# Patient Record
Sex: Male | Born: 1990 | Race: White | Hispanic: No | Marital: Single | Smoking: Current every day smoker
Health system: Southern US, Community
[De-identification: ages and names within clinical notes are randomized; demographics above are authoritative.]

## PROBLEM LIST (undated history)

## (undated) DIAGNOSIS — G40309 Generalized idiopathic epilepsy and epileptic syndromes, not intractable, without status epilepticus: Secondary | ICD-10-CM

## (undated) DIAGNOSIS — F1910 Other psychoactive substance abuse, uncomplicated: Secondary | ICD-10-CM

## (undated) DIAGNOSIS — F988 Other specified behavioral and emotional disorders with onset usually occurring in childhood and adolescence: Secondary | ICD-10-CM

## (undated) DIAGNOSIS — G40909 Epilepsy, unspecified, not intractable, without status epilepticus: Secondary | ICD-10-CM

## (undated) HISTORY — DX: Other psychoactive substance abuse, uncomplicated: F19.10

## (undated) HISTORY — DX: Generalized idiopathic epilepsy and epileptic syndromes, not intractable, without status epilepticus: G40.309

## (undated) HISTORY — DX: Other specified behavioral and emotional disorders with onset usually occurring in childhood and adolescence: F98.8

---
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
## 2009-07-23 ENCOUNTER — Emergency Department (HOSPITAL_COMMUNITY): Admission: EM | Admit: 2009-07-23 | Discharge: 2009-07-24 | Payer: Self-pay | Admitting: Emergency Medicine

## 2010-11-02 ENCOUNTER — Emergency Department (HOSPITAL_COMMUNITY)
Admission: EM | Admit: 2010-11-02 | Discharge: 2010-11-03 | Disposition: A | Discharge status: Home / Self Care | Payer: Self-pay | Attending: Emergency Medicine | Admitting: Emergency Medicine

## 2010-11-02 DIAGNOSIS — X58XXXA Exposure to other specified factors, initial encounter: Secondary | ICD-10-CM | POA: Insufficient documentation

## 2010-11-02 DIAGNOSIS — K089 Disorder of teeth and supporting structures, unspecified: Secondary | ICD-10-CM | POA: Insufficient documentation

## 2010-11-02 DIAGNOSIS — S025XXA Fracture of tooth (traumatic), initial encounter for closed fracture: Secondary | ICD-10-CM | POA: Insufficient documentation

## 2010-11-02 DIAGNOSIS — R569 Unspecified convulsions: Secondary | ICD-10-CM | POA: Insufficient documentation

## 2010-11-19 LAB — URINALYSIS, ROUTINE W REFLEX MICROSCOPIC
Hgb urine dipstick: NEGATIVE
Nitrite: NEGATIVE
Protein, ur: NEGATIVE
Specific Gravity, Urine: 1.019
Urobilinogen, UA: 1

## 2010-11-19 LAB — CBC
Hemoglobin: 16.2
MCHC: 34.6
MCV: 88.8
RBC: 5.26
WBC: 11.2 — ABNORMAL HIGH

## 2010-11-19 LAB — DIFFERENTIAL
Eosinophils Absolute: 0.2
Eosinophils Relative: 2
Lymphocytes Relative: 15
Lymphs Abs: 1.7
Neutro Abs: 8.5 — ABNORMAL HIGH
Neutrophils Relative %: 76

## 2010-11-19 LAB — COMPREHENSIVE METABOLIC PANEL
AST: 26
Calcium: 9.2
GFR calc Af Amer: >60
GFR calc non Af Amer: >60
Total Protein: 6.2

## 2012-05-18 ENCOUNTER — Telehealth: Payer: Self-pay

## 2012-05-18 NOTE — Telephone Encounter (Signed)
Patient called clinic for appt and refill on Depakote.  I spoke with pharmacy and he had refill on file, so does not need another refill at this time.  They will contact us when he needs a new rx.

## 2012-08-29 ENCOUNTER — Other Ambulatory Visit: Payer: Self-pay | Admitting: Neurology

## 2012-09-13 ENCOUNTER — Other Ambulatory Visit: Payer: Self-pay | Admitting: Neurology

## 2012-10-16 ENCOUNTER — Emergency Department (HOSPITAL_BASED_OUTPATIENT_CLINIC_OR_DEPARTMENT_OTHER)
Admission: EM | Admit: 2012-10-16 | Discharge: 2012-10-17 | Disposition: A | Discharge status: Home / Self Care | Payer: BC Managed Care – PPO | Attending: Emergency Medicine | Admitting: Emergency Medicine

## 2012-10-16 ENCOUNTER — Encounter (HOSPITAL_BASED_OUTPATIENT_CLINIC_OR_DEPARTMENT_OTHER): Payer: Self-pay | Admitting: *Deleted

## 2012-10-16 ENCOUNTER — Emergency Department (HOSPITAL_BASED_OUTPATIENT_CLINIC_OR_DEPARTMENT_OTHER): Payer: BC Managed Care – PPO

## 2012-10-16 DIAGNOSIS — L0390 Cellulitis, unspecified: Secondary | ICD-10-CM

## 2012-10-16 DIAGNOSIS — L0201 Cutaneous abscess of face: Secondary | ICD-10-CM | POA: Insufficient documentation

## 2012-10-16 DIAGNOSIS — Z79899 Other long term (current) drug therapy: Secondary | ICD-10-CM | POA: Insufficient documentation

## 2012-10-16 DIAGNOSIS — Z23 Encounter for immunization: Secondary | ICD-10-CM | POA: Insufficient documentation

## 2012-10-16 DIAGNOSIS — F17200 Nicotine dependence, unspecified, uncomplicated: Secondary | ICD-10-CM | POA: Insufficient documentation

## 2012-10-16 DIAGNOSIS — G40909 Epilepsy, unspecified, not intractable, without status epilepticus: Secondary | ICD-10-CM | POA: Insufficient documentation

## 2012-10-16 DIAGNOSIS — L03211 Cellulitis of face: Secondary | ICD-10-CM | POA: Insufficient documentation

## 2012-10-16 HISTORY — DX: Epilepsy, unspecified, not intractable, without status epilepticus: G40.909

## 2012-10-16 LAB — BASIC METABOLIC PANEL
CO2: 28 mEq/L (ref 19–32)
Calcium: 9.6 mg/dL (ref 8.4–10.5)
Chloride: 103 mEq/L (ref 96–112)
GFR calc Af Amer: >90 mL/min (ref >90)
GFR calc non Af Amer: >90 mL/min (ref >90)
Glucose, Bld: 78 mg/dL (ref 70–99)
Potassium: 3.7 mEq/L (ref 3.5–5.1)
Sodium: 139 mEq/L (ref 135–145)

## 2012-10-16 LAB — CBC WITH DIFFERENTIAL/PLATELET
Eosinophils Absolute: 0.3 10*3/uL (ref 0.0–0.7)
Lymphocytes Relative: 28 % (ref 12–46)
Lymphs Abs: 2.7 10*3/uL (ref 0.7–4.0)
MCH: 30.8 pg (ref 26.0–34.0)
Monocytes Absolute: 0.7 10*3/uL (ref 0.1–1.0)
Monocytes Relative: 8 % (ref 3–12)
Platelets: 221 10*3/uL (ref 150–400)
RBC: 4.29 MIL/uL (ref 4.22–5.81)

## 2012-10-16 NOTE — ED Notes (Signed)
Pt reports he had an are on his right jaw line that started out as a small pimple on Monday that has become increasingly swollen and red with a bloody drainage.

## 2012-10-16 NOTE — ED Provider Notes (Signed)
CSN: [id number]     Arrival date & time 10/16/12  2136 History  This chart was scribed for Derwood Kaplan, MD by Ronal Fear, ED Scribe. This patient was seen in room MH05/MH05 and the patient's care was started at 10:24 PM.    Chief Complaint  Patient presents with  . Skin Problem    The history is provided by the patient. No language interpreter was used.    HPI Comments: Edward Brewer is a 22 y.o. male who presents to the Emergency Department complaining of a possible pimple on his chin with bloody drainage onset 5 days ago. He states that he popped it when he first noticed it and there was a clear yellowish drainage but it got worse. Pt tried to pop it today with a syringe needle that had been used before and there was dark blood drainage. He denies fever and chills. Pt has a hx of epilepsy and takes divalproex. 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 He denies h/o diabetes or HTN.  Past Medical History  Diagnosis Date  . Epilepsy    History reviewed. No pertinent past surgical history. No family history on file. History  Substance Use Topics  . Smoking status: Current Every Day Smoker  . Smokeless tobacco: Not on file  . Alcohol Use: Yes    Review of Systems  Constitutional: Negative for fever and chills.  Skin: Positive for wound (right submandible).  All other systems reviewed and are negative.    Allergies  Review of patient's allergies indicates not on file.  Home Medications   Current Outpatient Rx  Name  Route  Sig  Dispense  Refill  . divalproex (DEPAKOTE ER) 500 MG 24 hr tablet      TAKE 1 TABLET BY MOUTH EVERY MORNING AND TAKE 2 TABLETS BY MOUTH AT BEDTIME   90 tablet   0     PATIENT NEEDS TO SCHEDULE APPT    BP 119/81  Pulse 73  Temp(Src) 98.3 F (36.8 C) (Oral)  SpO2 100% Physical Exam  Nursing note and vitals reviewed. Constitutional: He is oriented to person, place, and time. He appears well-developed and well-nourished. No  distress.  HENT:  Head: Normocephalic and atraumatic.  Normal oral exam. Negative Ludwig's angina. Base of tounge is normal. Gingival exam is normal.  Eyes: EOM are normal.  Neck: Neck supple. No tracheal deviation present.  No lymphadenopathy  Cardiovascular: Normal rate, regular rhythm and normal heart sounds.   No murmur heard. Pulmonary/Chest: Effort normal and breath sounds normal. No respiratory distress. He has no wheezes. He has no rales.  Abdominal: Soft. There is no tenderness.  Musculoskeletal: Normal range of motion.  Neurological: He is alert and oriented to person, place, and time.  Skin: Skin is warm and dry.  7 x 9 cm area of erythema over chin and submandibular space with scab ? Possible necrotic lesion in the middle with tenderness to palpation.   Psychiatric: He has a normal mood and affect. His behavior is normal.    ED Course  Procedures (including critical care time)  DIAGNOSTIC STUDIES: Oxygen Saturation is 100% on RA, normal by my interpretation.    COORDINATION OF CARE: 10:36 PM- Pt advised of plan for treatment includng x ray for abscess and pt agrees.   Labs Review Labs Reviewed - No data to display Imaging Review No results found.  MDM  No diagnosis found. I personally performed the services described in this documentation, which was  scribed in my presence. The recorded information has been reviewed and is accurate.  Pt comes in with cc of chin wound. Pt has what appears to be cellulitis, with possibly developing abscess. CT ordered.   Derwood Kaplan, MD 10/17/12 [phone]

## 2012-10-17 MED ORDER — SULFAMETHOXAZOLE-TMP DS 800-160 MG PO TABS
2.0000 | ORAL_TABLET | Freq: Once | ORAL | Status: AC
  Administered 2012-10-17: 2 via ORAL
  Filled 2012-10-17: qty 1

## 2012-10-17 MED ORDER — SULFAMETHOXAZOLE-TRIMETHOPRIM 800-160 MG PO TABS: 2.0000 | ORAL_TABLET | Freq: Two times a day (BID) | ORAL | Status: DC

## 2012-10-17 MED ORDER — TETANUS-DIPHTH-ACELL PERTUSSIS 5-2.5-18.5 LF-MCG/0.5 IM SUSP
0.5000 mL | Freq: Once | INTRAMUSCULAR | Status: AC
  Administered 2012-10-17: 0.5 mL via INTRAMUSCULAR
  Filled 2012-10-17: qty 0.5

## 2012-10-17 MED ORDER — IOHEXOL 300 MG/ML  SOLN
80.0000 mL | Freq: Once | INTRAMUSCULAR | Status: AC | PRN
  Administered 2012-10-17: 80 mL via INTRAVENOUS

## 2012-10-17 MED ORDER — HYDROCODONE-ACETAMINOPHEN 5-325 MG PO TABS: 1.0000 | ORAL_TABLET | Freq: Four times a day (QID) | ORAL | Status: DC | PRN

## 2012-10-17 MED ORDER — CEPHALEXIN 500 MG PO CAPS: 500.0000 mg | ORAL_CAPSULE | Freq: Four times a day (QID) | ORAL | Status: DC

## 2012-10-17 MED ORDER — CEPHALEXIN 250 MG PO CAPS
500.0000 mg | ORAL_CAPSULE | Freq: Once | ORAL | Status: AC
  Administered 2012-10-17: 500 mg via ORAL
  Filled 2012-10-17: qty 1

## 2012-10-17 MED ORDER — IBUPROFEN 600 MG PO TABS: 600.0000 mg | ORAL_TABLET | Freq: Four times a day (QID) | ORAL | Status: DC | PRN

## 2012-11-23 ENCOUNTER — Other Ambulatory Visit: Payer: Self-pay | Admitting: Neurology

## 2013-04-15 ENCOUNTER — Encounter (INDEPENDENT_AMBULATORY_CARE_PROVIDER_SITE_OTHER): Payer: Self-pay

## 2013-04-15 ENCOUNTER — Ambulatory Visit (INDEPENDENT_AMBULATORY_CARE_PROVIDER_SITE_OTHER): Payer: BC Managed Care – PPO | Admitting: Neurology

## 2013-04-15 ENCOUNTER — Encounter: Payer: Self-pay | Admitting: Neurology

## 2013-04-15 VITALS — BP 108/70 | HR 74 | Ht 69.0 in | Wt 178.0 lb

## 2013-04-15 DIAGNOSIS — G40309 Generalized idiopathic epilepsy and epileptic syndromes, not intractable, without status epilepticus: Secondary | ICD-10-CM

## 2013-04-15 DIAGNOSIS — Z5181 Encounter for therapeutic drug level monitoring: Secondary | ICD-10-CM

## 2013-04-15 HISTORY — DX: Generalized idiopathic epilepsy and epileptic syndromes, not intractable, without status epilepticus: G40.309

## 2013-04-15 LAB — CBC WITH DIFFERENTIAL
BASOS: 1 %
Basophils Absolute: 0 10*3/uL (ref 0.0–0.2)
EOS ABS: 0.1 10*3/uL (ref 0.0–0.4)
EOS: 2 %
HEMATOCRIT: 50.3 % (ref 37.5–51.0)
HEMOGLOBIN: 17.6 g/dL (ref 12.6–17.7)
LYMPHS ABS: 2.1 10*3/uL (ref 0.7–3.1)
LYMPHS: 39 %
MCH: 31.4 pg (ref 26.6–33.0)
MCHC: 35 g/dL (ref 31.5–35.7)
MCV: 90 fL (ref 79–97)
Monocytes Absolute: 0.5 10*3/uL (ref 0.1–0.9)
Monocytes: 9 %
NEUTROS ABS: 2.6 10*3/uL (ref 1.4–7.0)
NEUTROS PCT: 49 %
PLATELETS: 277 10*3/uL (ref 150–379)
RBC: 5.61 x10E6/uL (ref 4.14–5.80)
RDW: 14.5 % (ref 12.3–15.4)
WBC: 5.3 10*3/uL (ref 3.4–10.8)

## 2013-04-15 LAB — COMPREHENSIVE METABOLIC PANEL
ALBUMIN: 4.7 g/dL (ref 3.5–5.5)
ALK PHOS: 113 IU/L (ref 39–117)
ALT: 14 IU/L (ref 0–44)
AST: 24 IU/L (ref 0–40)
Albumin/Globulin Ratio: 1.7 (ref 1.1–2.5)
BUN / CREAT RATIO: 6 — AB (ref 8–19)
BUN: 5 mg/dL — AB (ref 6–20)
CALCIUM: 9.6 mg/dL (ref 8.7–10.2)
CHLORIDE: 103 mmol/L (ref 96–108)
CO2: 26 mmol/L (ref 18–29)
CREATININE: 0.88 mg/dL (ref 0.76–1.27)
GFR, EST AFRICAN AMERICAN: 139 mL/min/{1.73_m2} (ref >59)
GFR, EST NON AFRICAN AMERICAN: 120 mL/min/{1.73_m2} (ref >59)
GLUCOSE: 68 mg/dL (ref 65–99)
Globulin, Total: 2.7 g/dL (ref 1.5–4.5)
Potassium: 4.3 mmol/L (ref 3.5–5.2)
SODIUM: 142 mmol/L (ref 134–144)
Total Bilirubin: 0.3 mg/dL (ref 0.0–1.2)
Total Protein: 7.4 g/dL (ref 6.0–8.5)

## 2013-04-15 LAB — VALPROIC ACID LEVEL: Valproic Acid Lvl: 57 ug/mL (ref 50–100)

## 2013-04-15 MED ORDER — DIVALPROEX SODIUM ER 500 MG PO TB24: ORAL_TABLET | ORAL | Status: DC

## 2013-04-15 NOTE — Patient Instructions (Signed)
Epilepsy Epilepsy is a disorder in which a person has repeated seizures over time. A seizure is a release of abnormal electrical activity in the brain. Seizures can cause a change in attention, behavior, or the ability to remain awake and alert (altered mental status). Seizures often involve uncontrollable shaking (convulsions).  Most people with epilepsy lead normal lives. However, people with epilepsy are at an increased risk of falls, accidents, and injuries. Therefore, it is important to begin treatment right away. CAUSES  Epilepsy has many possible causes. Anything that disturbs the normal pattern of brain cell activity can lead to seizures. This may include:   Head injury.  Birth trauma.  High fever as a child.  Stroke.  Bleeding into or around the brain.  Certain drugs.  Prolonged low oxygen, such as what occurs after CPR efforts.  Abnormal brain development.  Certain illnesses, such as meningitis, encephalitis (brain infection), malaria, and other infections.  An imbalance of nerve signaling chemicals (neurotransmitters).  SIGNS AND SYMPTOMS  The symptoms of a seizure can vary greatly from one person to another. Right before a seizure, you may have a warning (aura) that a seizure is about to occur. An aura may include the following symptoms:  Fear or anxiety.  Nausea.  Feeling like the room is spinning (vertigo).  Vision changes, such as seeing flashing lights or spots. Common symptoms during a seizure include:  Abnormal sensations, such as an abnormal smell or a bitter taste in the mouth.   Sudden, general body stiffness.   Convulsions that involve rhythmic jerking of the face, arm, or leg on one or both sides.   Sudden change in consciousness.   Appearing to be awake but not responding.   Appearing to be asleep but cannot be awakened.   Grimacing, chewing, lip smacking, drooling, tongue biting, or loss of bowel or bladder control. After a seizure,  you may feel sleepy for a while. DIAGNOSIS  Your health care provider will ask about your symptoms and take a medical history. Descriptions from any witnesses to your seizures will be very helpful in the diagnosis. A physical exam, including a detailed neurological exam, is necessary. Various tests may be done, such as:   An electroencephalogram (EEG). This is a painless test of your brain waves. In this test, a diagram is created of your brain waves. These diagrams can be interpreted by a specialist.  An MRI of the brain.   A CT scan of the brain.   A spinal tap (lumbar puncture, LP).  Blood tests to check for signs of infection or abnormal blood chemistry. TREATMENT  There is no cure for epilepsy, but it is generally treatable. Once epilepsy is diagnosed, it is important to begin treatment as soon as possible. For most people with epilepsy, seizures can be controlled with medicines. The following may also be used:  A pacemaker for the brain (vagus nerve stimulator) can be used for people with seizures that are not well controlled by medicine.  Surgery on the brain. For some people, epilepsy eventually goes away. HOME CARE INSTRUCTIONS   Follow your health care provider's recommendations on driving and safety in normal activities.  Get enough rest. Lack of sleep can cause seizures.  Only take over-the-counter or prescription medicines as directed by your health care provider. Take any prescribed medicine exactly as directed.  Avoid any known triggers of your seizures.  Keep a seizure diary. Record what you recall about any seizure, especially any possible trigger.   Make   sure the people you live and work with know that you are prone to seizures. They should receive instructions on how to help you. In general, a witness to a seizure should:   Cushion your head and body.   Turn you on your side.   Avoid unnecessarily restraining you.   Not place anything inside your  mouth.   Call for emergency medical help if there is any question about what has occurred.   Follow up with your health care provider as directed. You may need regular blood tests to monitor the levels of your medicine.  SEEK MEDICAL CARE IF:   You develop signs of infection or other illness. This might increase the risk of a seizure.   You seem to be having more frequent seizures.   Your seizure pattern is changing.  SEEK IMMEDIATE MEDICAL CARE IF:   You have a seizure that does not stop after a few moments.   You have a seizure that causes any difficulty in breathing.   You have a seizure that results in a very severe headache.   You have a seizure that leaves you with the inability to speak or use a part of your body.  Document Released: 01/27/2005 Document Revised: 11/17/2012 Document Reviewed: 09/08/2012 [employer] Patient Information 2014 [employer].  

## 2013-04-15 NOTE — Progress Notes (Signed)
    Reason for visit: Seizures  Edward Brewer is an 23 y.o. male  History of present illness:  Edward Brewer is a 23 year old right-handed white male with a history of polysubstance abuse, ADD, and a history of seizures. The patient has been on Depakote, and generally in the past when he has had seizures, and he has come off of the medication. The patient indicates that he has not had a seizure in over 2 years. The patient does not have a car, but he does have a driver's license. 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 The patient is currently off this drug. The patient is taking Depakote only at this time. The patient is not working currently. The patient returns for an evaluation.  Past Medical History  Diagnosis Date  . Epilepsy   . 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 ADD (attention deficit disorder)   . Polysubstance abuse     Heroin, marijuana abuse    History reviewed. No pertinent past surgical history.  Family History  Problem Relation Age of Onset  . Diabetes Father   . Seizures Neg Hx     Social history:  reports that he has been smoking.  He has never used smokeless tobacco. He reports that he drinks alcohol. He reports that he uses illicit drugs (Marijuana and Heroin).   No Known Allergies  Medications:  No current outpatient prescriptions on file prior to visit.   No current facility-administered medications on file prior to visit.    ROS:  Out of a complete 14 system review of symptoms, the patient complains only of the following symptoms, and all other reviewed systems are negative.  Cough Insomnia Sleep talking History of seizures  Blood pressure 108/70, pulse 74, height 5\' 9"  (1.753 m), weight 178 lb (80.74 kg).  Physical Exam  General: The patient is alert and cooperative at the time of the examination.  Skin: No significant peripheral edema is  noted.   Neurologic Exam  Mental status: The patient is oriented x 3.  Cranial nerves: Facial symmetry is present. Speech is normal, no aphasia or dysarthria is noted. Extraocular movements are full. Visual fields are full.  Motor: The patient has good strength in all 4 extremities.  Sensory examination: Soft touch sensation is symmetric on the face, arms, and legs.  Coordination: The patient has good finger-nose-finger and heel-to-shin bilaterally.  Gait and station: The patient has a normal gait. Tandem gait is normal. Romberg is negative. No drift is seen.  Reflexes: Deep tendon reflexes are symmetric.   CT head 01/08/2007:   IMPRESSION:  No acute intracranial abnormality.    Assessment/Plan:  1. History of seizures, epilepsy  2. Polysubstance abuse  The patient will be maintained on the Depakote. The patient was given a prescription today, and he will have blood work. The patient will followup in one year.  Marlan Palau. Keith Kyndahl Jablon MD 04/16/2013 5:57 PM  [facility] [address] [place], [place] [id number]  Phone [phone] Fax [phone]

## 2013-04-18 NOTE — Progress Notes (Signed)
Quick Note:  Shared unremarkable blood work with patient , verbalized understanding ______

## 2013-09-11 ENCOUNTER — Emergency Department (HOSPITAL_COMMUNITY): Payer: BC Managed Care – PPO

## 2013-09-11 ENCOUNTER — Emergency Department (HOSPITAL_COMMUNITY)
Admission: EM | Admit: 2013-09-11 | Discharge: 2013-09-11 | Disposition: A | Discharge status: Home / Self Care | Payer: BC Managed Care – PPO | Attending: Emergency Medicine | Admitting: Emergency Medicine

## 2013-09-11 ENCOUNTER — Encounter (HOSPITAL_COMMUNITY): Payer: Self-pay | Admitting: Emergency Medicine

## 2013-09-11 DIAGNOSIS — S4980XA Other specified injuries of shoulder and upper arm, unspecified arm, initial encounter: Secondary | ICD-10-CM | POA: Insufficient documentation

## 2013-09-11 DIAGNOSIS — Y9351 Activity, roller skating (inline) and skateboarding: Secondary | ICD-10-CM | POA: Insufficient documentation

## 2013-09-11 DIAGNOSIS — Z79899 Other long term (current) drug therapy: Secondary | ICD-10-CM | POA: Insufficient documentation

## 2013-09-11 DIAGNOSIS — S4350XA Sprain of unspecified acromioclavicular joint, initial encounter: Secondary | ICD-10-CM | POA: Insufficient documentation

## 2013-09-11 DIAGNOSIS — M25512 Pain in left shoulder: Secondary | ICD-10-CM

## 2013-09-11 DIAGNOSIS — Y9239 Other specified sports and athletic area as the place of occurrence of the external cause: Secondary | ICD-10-CM | POA: Insufficient documentation

## 2013-09-11 DIAGNOSIS — Y92838 Other recreation area as the place of occurrence of the external cause: Secondary | ICD-10-CM

## 2013-09-11 DIAGNOSIS — Z8659 Personal history of other mental and behavioral disorders: Secondary | ICD-10-CM | POA: Insufficient documentation

## 2013-09-11 DIAGNOSIS — F17200 Nicotine dependence, unspecified, uncomplicated: Secondary | ICD-10-CM | POA: Insufficient documentation

## 2013-09-11 DIAGNOSIS — Z8709 Personal history of other diseases of the respiratory system: Secondary | ICD-10-CM | POA: Insufficient documentation

## 2013-09-11 DIAGNOSIS — S4992XA Unspecified injury of left shoulder and upper arm, initial encounter: Secondary | ICD-10-CM

## 2013-09-11 DIAGNOSIS — S46909A Unspecified injury of unspecified muscle, fascia and tendon at shoulder and upper arm level, unspecified arm, initial encounter: Secondary | ICD-10-CM | POA: Insufficient documentation

## 2013-09-11 MED ORDER — IBUPROFEN 600 MG PO TABS: 600.0000 mg | ORAL_TABLET | Freq: Four times a day (QID) | ORAL | Status: DC | PRN

## 2013-09-11 NOTE — ED Provider Notes (Signed)
CSN: [id number]     Arrival date & time 09/11/13  1948 History   None    This chart was scribed for non-physician practitioner, Raymon Mutton PA-C working with Glynn Octave, MD by Arlan Organ, ED Scribe. This patient was seen in room TR05C/TR05C and the patient's care was started at 10:46 PM.   Chief Complaint  Patient presents with  . Shoulder Injury   The history is provided by the patient. No language interpreter was used.    HPI Comments: Edward Brewer is a 23 y.o. male with a PMHx of Epilepsy, polysubstance abuse and ADD who presents to the Emergency Department complaining of a L shoulder injury sustained around 3-4 PM this afternoon. Pt states he was riding his skateboard when he fell to the ground after hitting the side of a ramp. He now c/o constant, moderate, non-radiating pain to the L shoulder that is unchanged at this time. Pain is exacerbated with certain movements and alleviated at rest. He has not tried an OTC medications or any home remedies to help manage symptoms. Denied hitting head/head injury, LOC. At this time he denies any fever, chills, SOB, or CP. No neck pain, neck stiffness, visual changes, numbness, loss of sensation, weakness, or paresthesia. No known allergies to medications. No other concerns this visit.  Past Medical History  Diagnosis Date  . Epilepsy   . 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 ADD (attention deficit disorder)   . Polysubstance abuse     Heroin, marijuana abuse   History reviewed. No pertinent past surgical history. Family History  Problem Relation Age of Onset  . Diabetes Father   . Seizures Neg Hx    History  Substance Use Topics  . Smoking status: Current Every Day Smoker -- 1.00 packs/day  . Smokeless tobacco: Never Used  . Alcohol Use: Yes     Comment: occasional 6 beers a week    Review of Systems  Constitutional: Negative for fever and chills.  Respiratory: Negative for  shortness of breath.   Cardiovascular: Negative for chest pain.  Gastrointestinal: Negative for nausea and vomiting.  Musculoskeletal: Positive for arthralgias (L shoulder).  Neurological: Negative for weakness and numbness.  Psychiatric/Behavioral: Negative for confusion.      Allergies  Review of patient's allergies indicates no known allergies.  Home Medications   Prior to Admission medications   Medication Sig Start Date End Date Taking? Authorizing Provider  divalproex (DEPAKOTE ER) 500 MG 24 hr tablet Take 500 mg by mouth 3 (three) times daily. One tablet in the morning and two tablets in the evening per patient 04/15/13  Yes York Spaniel, MD  ibuprofen (ADVIL,MOTRIN) 600 MG tablet Take 1 tablet (600 mg total) by mouth every 6 (six) hours as needed. 09/11/13   Annabella Elford, PA-C   Triage Vitals: BP 118/60  Pulse 62  Temp(Src) 97.7 F (36.5 C) (Oral)  Resp 14  Ht 5\' 9"  (1.753 m)  Wt 166 lb 4.8 oz (75.433 kg)  BMI 24.55 kg/m2  SpO2 100%   Physical Exam  Nursing note and vitals reviewed. Constitutional: He is oriented to person, place, and time. He appears well-developed and well-nourished.  HENT:  Head: Normocephalic and atraumatic.  Mouth/Throat: Oropharynx is clear and moist. No oropharyngeal exudate.  Eyes: Conjunctivae and EOM are normal. Pupils are equal, round, and reactive to light. Right eye exhibits no discharge. Left eye exhibits no discharge.  Neck: Normal range of motion. Neck supple. No tracheal  deviation present.  Cardiovascular: Normal rate, regular rhythm and normal heart sounds.  Exam reveals no friction rub.   No murmur heard. Pulmonary/Chest: Effort normal and breath sounds normal. No respiratory distress. He has no wheezes. He has no rales.  Abdominal: He exhibits no distension.  Musculoskeletal: Normal range of motion. He exhibits tenderness.       Arms: Mild raising identified to the acromioclavicular region of the left shoulder with negative  erythema, inflammation, lesions, sores. Negative tenting of the clavicles - negative pain upon palpation to the sternoclavicular joint - negative deformities noted. Negative breakthrough of the skin. Negative open fracture noted. Negative puncture wounds. Negative sunken in appearance. Full range of motion identified to the left upper extremity-full flexion, extension, abduction, adduction. Discomfort with abduction of the left arm. Patient able to bring into both hands above head without difficulty. Full range of motion to left elbow, wrist and digits of the left hand.  Lymphadenopathy:    He has no cervical adenopathy.  Neurological: He is alert and oriented to person, place, and time. No cranial nerve deficit. He exhibits normal muscle tone. Coordination normal.  Cranial nerves III-XII grossly intact Strength 5+/5+ to upper extremities bilaterally with resistance applied, equal distribution noted Strength intact to MCP, PIP, DIP joints of left hand Sensation intact with differentiation to sharp and dull touch Equal grip strength bilaterally Negative facial drooping Negative slurred speech Negative aphasia Negative arm drift Fine motor skills intact Gait proper, proper balance - negative sway, negative drift, negative step-offs  Skin: Skin is warm and dry. No rash noted. No erythema.  Psychiatric: He has a normal mood and affect. His behavior is normal. Thought content normal.    ED Course  Procedures (including critical care time)  DIAGNOSTIC STUDIES: Oxygen Saturation is 100% on RA, Normal by my interpretation.    COORDINATION OF CARE: 10:58 PM- Will order DG shoulder L. Discussed treatment plan with pt at bedside and pt agreed to plan.     Labs Review Labs Reviewed - No data to display  Imaging Review Dg Shoulder Left  09/11/2013   CLINICAL DATA:  Fall with anterior shoulder pain  EXAM: LEFT SHOULDER - 2+ VIEW  COMPARISON:  None.  FINDINGS: There is no evidence of fracture or  dislocation. There is no evidence of arthropathy or other focal bone abnormality. Soft tissues are unremarkable.  IMPRESSION: Negative.   Electronically Signed   By: Tiburcio Pea M.D.   On: 09/11/2013 22:02     EKG Interpretation None      MDM   Final diagnoses:  Left shoulder pain  Acromioclavicular joint injury, left, initial encounter    Filed Vitals:   09/11/13 2043 09/11/13 2317  BP: 118/60 118/75  Pulse: 62 66  Temp: 97.7 F (36.5 C)   TempSrc: Oral   Resp: 14 16  Height: 5\' 9"  (1.753 m)   Weight: 166 lb 4.8 oz (75.433 kg)   SpO2: 100% 100%    I personally performed the services described in this documentation, which was scribed in my presence. The recorded information has been reviewed and is accurate.  Patient presenting to the ED with left shoulder pain after falling off a skateboard earlier this morning. Full range of motion identified. Negative focal neurological deficits noted. Strength intact MCP, PIP, DIP joints. Pulses palpable and strong to radial 2+ bilaterally. Cap refill less than 3 seconds Plain film of left shoulder negative for acute osseous injury-there is no evidence of arthropathy or other focal  bone abnormalities-no evidence of fracture or dislocation. Cannot rule out possible acromioclavicular strain. Discussed case with attending physician who agreed with the sling immobilizer and discharge. Patient placed in sling immobilizer. Patient stable, afebrile. Patient not septic appearing. Discharged patient. Referred patient to orthopedics. Discussed with patient to rest, ice, elevate. Discussed with patient to avoid any physical strenuous activity. Discussed with patient to closely monitor symptoms and if symptoms are to worsen or change to report back to the ED - strict return instructions given.  Patient agreed to plan of care, understood, all questions answered.   Raymon MuttonMarissa Jaan Fischel, PA-C 09/12/13 1345

## 2013-09-11 NOTE — ED Notes (Signed)
Pt reports fell on shoulder while skateboarding. Pt with raised tender area to top of shoulder. Pt able to raise arm but has trouble moving arm forward. PMS intact distally.

## 2013-09-11 NOTE — ED Notes (Signed)
Pt placed in gown.  Abrasion noted to L shoulder.  Pt states that has been there for a while and is unrelated to this injury.  Pt states if his arm is stationary there is no pain.  When pt moves arm pain is 10/10.

## 2013-09-11 NOTE — Discharge Instructions (Signed)
Please call your doctor for a followup appointment within 24-48 hours. When you talk to your doctor please let them know that you were seen in the emergency department and have them acquire all of your records so that they can discuss the findings with you and formulate a treatment plan to fully care for your new and ongoing problems. Please call and set up an appointment with orthopedics Please rest and stay hydrated Please avoid any physical or strenuous activity Please keep arm in sling at all times Please use ibuprofen as prescribed-no more than 2400 mg per day Please apply ice Please continue to monitor symptoms closely if symptoms are to worsen or change (fever greater than 101, chills, chest pain, shortness of breath, difficulty breathing, numbness, tingling, worsening or changes to pain pattern, swelling to the arm, changes to skin colored, loss of sensation, fall, injury) please report back to the ED immediately   Acromioclavicular Injuries [facility] (acromioclavicular) joint is the joint in the shoulder where the collarbone (clavicle) meets the shoulder blade (scapula). The part of the shoulder blade connected to the collarbone is called the acromion. Common problems with and treatments for the [facility] joint are detailed below. ARTHRITIS Arthritis occurs when the joint has been injured and the smooth padding between the joints (cartilage) is lost. This is the wear and tear seen in most joints of the body if they have been overused. This causes the joint to produce pain and swelling which is worse with activity.  AC JOINT SEPARATION AC joint separation means that the ligaments connecting the acromion of the shoulder blade and collarbone have been damaged, and the two bones no longer line up. AC separations can be anywhere from mild to severe, and are "graded" depending upon which ligaments are torn and how badly they are torn.  Grade I Injury: the least damage is done, and the [facility] joint still lines  up.  Grade II Injury: damage to the ligaments which reinforce the [facility] joint. In a Grade II injury, these ligaments are stretched but not entirely torn. When stressed, the [facility] joint becomes painful and unstable.  Grade III Injury: AC and secondary ligaments are completely torn, and the collarbone is no longer attached to the shoulder blade. This results in deformity; a prominence of the end of the clavicle. AC JOINT FRACTURE AC joint fracture means that there has been a break in the bones of the [facility] joint, usually the end of the clavicle. TREATMENT TREATMENT OF AC ARTHRITIS  There is currently no way to replace the cartilage damaged by arthritis. The best way to improve the condition is to decrease the activities which aggravate the problem. Application of ice to the joint helps decrease pain and soreness (inflammation). The use of non-steroidal anti-inflammatory medication is helpful.  If less conservative measures do not work, then cortisone shots (injections) may be used. These are anti-inflammatories; they decrease the soreness in the joint and swelling.  If non-surgical measures fail, surgery may be recommended. The procedure is generally removal of a portion of the end of the clavicle. This is the part of the collarbone closest to your acromion which is stabilized with ligaments to the acromion of the shoulder blade. This surgery may be performed using a tube-like instrument with a light (arthroscope) for looking into a joint. It may also be performed as an open surgery through a small incision by the surgeon. Most patients will have good range of motion within 6 weeks and may return to all  activity including sports by 8-12 weeks, barring complications. TREATMENT OF AN AC SEPARATION  The initial treatment is to decrease pain. This is best accomplished by immobilizing the arm in a sling and placing an ice pack to the shoulder for 20 to 30 minutes every 2 hours as needed. As the pain starts to  subside, it is important to begin moving the fingers, wrist, elbow and eventually the shoulder in order to prevent a stiff or "frozen" shoulder. Instruction on when and how much to move the shoulder will be provided by your caregiver. The length of time needed to regain full motion and function depends on the amount or grade of the injury. Recovery from a Grade I AC separation usually takes 10 to 14 days, whereas a Grade III may take 6 to 8 weeks.  Grade I and II separations usually do not require surgery. Even Grade III injuries usually allow return to full activity with few restrictions. Treatment is also based on the activity demands of the injured shoulder. For example, a high level quarterback with an injured throwing arm will receive more aggressive treatment than someone with a desk job who rarely uses his/her arm for strenuous activities. In some cases, a painful lump may persist which could require a later surgery. Surgery can be very successful, but the benefits must be weighed against the potential risks. TREATMENT OF AN AC JOINT FRACTURE Fracture treatment depends on the type of fracture. Sometimes a splint or sling may be all that is required. Other times surgery may be required for repair. This is more frequently the case when the ligaments supporting the clavicle are completely torn. Your caregiver will help you with these decisions and together you can decide what will be the best treatment. HOME CARE INSTRUCTIONS   Apply ice to the injury for 15-20 minutes each hour while awake for 2 days. Put the ice in a plastic bag and place a towel between the bag of ice and skin.  If a sling has been applied, wear it constantly for as long as directed by your caregiver, even at night. The sling or splint can be removed for bathing or showering or as directed. Be sure to keep the shoulder in the same place as when the sling is on. Do not lift the arm.  If a figure-of-eight splint has been applied it  should be tightened gently by another person every day. Tighten it enough to keep the shoulders held back. Allow enough room to place the index finger between the body and strap. Loosen the splint immediately if there is numbness or tingling in the hands.  Take over-the-counter or prescription medicines for pain, discomfort or fever as directed by your caregiver.  If you or your child has received a follow up appointment, it is very important to keep that appointment in order to avoid long term complications, chronic pain or disability. SEEK MEDICAL CARE IF:   The pain is not relieved with medications.  There is increased swelling or discoloration that continues to get worse rather than better.  You or your child has been unable to follow up as instructed.  There is progressive numbness and tingling in the arm, forearm or hand. SEEK IMMEDIATE MEDICAL CARE IF:   The arm is numb, cold or pale.  There is increasing pain in the hand, forearm or fingers. MAKE SURE YOU:   Understand these instructions.  Will watch your condition.  Will get help right away if you are not doing well  or get worse. Document Released: 11/06/2004 Document Revised: 04/21/2011 Document Reviewed: 05/01/2008 [facility] Patient Information 2015 [place], [place]. This information is not intended to replace advice given to you by your health care provider. Make sure you discuss any questions you have with your health care provider.   Emergency Department Resource Guide 1) Find a Doctor and Pay Out of Pocket Although you won't have to find out who is covered by your insurance plan, it is a good idea to ask around and get recommendations. You will then need to call the office and see if the doctor you have chosen will accept you as a new patient and what types of options they offer for patients who are self-pay. Some doctors offer discounts or will set up payment plans for their patients who do not have insurance, but you  will need to ask so you aren't surprised when you get to your appointment.  2) Contact Your Local Health Department Not all health departments have doctors that can see patients for sick visits, but many do, so it is worth a call to see if yours does. If you don't know where your local health department is, you can check in your phone book. The CDC also has a tool to help you locate your state's health department, and many state websites also have listings of all of their local health departments.  3) Find a Walk-in Clinic If your illness is not likely to be very severe or complicated, you may want to try a walk in clinic. These are popping up all over the country in pharmacies, drugstores, and shopping centers. They're usually staffed by nurse practitioners or physician assistants that have been trained to treat common illnesses and complaints. They're usually fairly quick and inexpensive. However, if you have serious medical issues or chronic medical problems, these are probably not your best option.  No Primary Care Doctor: - Call Health Connect at  [phone] - they can help you locate a primary care doctor that  accepts your insurance, provides certain services, etc. - Physician Referral Service- [phone]  Chronic Pain Problems: Organization         Address  Phone   Notes  [facility]  [phone] Patients need to be referred by their primary care doctor.   Medication Assistance: Organization         Address  Phone   Notes  [facility] Medication [facility] [address] [place] [place], [place] [address] [phone] --Must be a resident of [facility] -- Must have NO insurance coverage whatsoever (no Medicaid/ Medicare, etc.) -- The pt. MUST have a primary care doctor that directs their care regularly and follows them in the community   [facility]ist  [phone]   [employer]  [phone]    Agencies that provide inexpensive medical  care: Organization         Address  Phone   Notes  [facility]  [phone]   [facility]    [phone]   [facility] [address] [place], [place] [address] [phone]   [facility] [address] [place] [address] [place] [phone]   Planned Parenthood    [phone]   [facility]    [phone]   [facility]  [address], [place] Phone:  [phone], Fax:  [phone] Hours of Operation:  9 am -  6 pm, M-F.  Also accepts Medicaid/Medicare and self-pay.  [facility]  [address], [place] Phone: [phone], Fax: [phone]. Hours of Operation:  8:30 am - 5:30 pm, M-F.  Also accepts Medicaid and self-pay.  [facility] [place] [address] [place] Phone: [phone]   [facility] [address] Natasha Bence [place], [place] [phone], Ext. [phone] Mondays & Thursdays: 7-9 AM.  First 15 patients are seen on a first come, first serve basis.    Medicaid-accepting [facility] Providers:  Organization         Address  Phone   Notes  [facility] [address], [place] [phone] Also accepts self-pay patients.  [facility] [address] Laurell Josephs [place], [place]  [phone]   [facility] [address], [place] [phone]   [facility] [address], [place] [phone]   Renaye Rakers [address], [place]   [phone] Only accepts [place] patients after they have their name applied to their card.   Self-Pay (no insurance) in [facility]:  Organization         Address  Phone   Notes  Sickle Cell Patients, [facility] [address] [place], [place] [phone]   [facility] [address] [place],  [place] [phone]   [facility] [place]  [address], [place] [phone]   [facility]/Dr. Osei-Bonsu  [address] [place] or [address], [place] [phone] Phone number for both [place] and [place] locations is the same.  [facility] [address] [place] [phone]   [facility] [address], [place] or [address] [phone]   [facility] [address], [place] [phone], phone; [phone], fax Sees patients 1st and 3rd Saturday of every month.  Must not qualify for public or private insurance (i.e. Medicaid, Medicare, [place] Health Choice, Veterans' Benefits)  Household income should be no more than 200% of the poverty level The clinic cannot treat you if you are pregnant or think you are pregnant  Sexually transmitted diseases are not treated at the clinic.    Dental Care: Organization         Address  Phone  Notes  [facility] Department of [facility] [address] [place], [place] [phone] Accepts children up to age 51 who are enrolled in [place] or [place] Health Choice; pregnant women with a Medicaid card; and children who have applied for Medicaid or [place] Health Choice, but were declined, whose parents can pay a reduced fee at time of service.  [facility] Department of [facility]  [address], [place] [phone] Accepts children up to age 40 who are enrolled in [place] or [place] Health Choice; pregnant women with a Medicaid card; and children who have applied for Medicaid or  Health Choice, but were declined, whose parents can pay a reduced fee at time of service.  [facility]  [address] [place], [place] [phone] Patients are seen by appointment only. Walk-ins are not accepted. [facility] will see patients 38 years of age and  older. Monday - Tuesday (8am-5pm) Most Wednesdays (8:30-5pm) $30 per visit, cash only  [facility] [employer] PROGRAM  [address], [facility] [phone] Patients are seen by appointment only. Walk-ins are not accepted. [facility] will see patients 68 years of age and older. One Wednesday Evening (Monthly: Volunteer Based).  $30 per visit, cash only  [employer]  [phone] for adults; Children under age 36, call Graduate Pediatric Dentistry at [phone]. Children aged 49-14, please call [phone] to request a pediatric application.  Dental services are provided in all areas of dental care including fillings, crowns and bridges, complete and partial dentures, implants, gum treatment, root canals, and extractions. Preventive care is also provided. Treatment is provided to both adults and children. Patients are selected via a lottery and there is often a waiting list.   [facility] [address], [place]  [phone] [email or web address]   [facility] [address] [place], [place] [phone], Ext. [phone] Second and Fourth Thursday of each month, opens at 6:30 AM; Clinic ends at 9 AM.  Patients are seen on a first-come first-served basis, and a limited number are seen during each clinic.   [facility]  [address] Ether Griffins [place], [place] [phone]   Eligibility Requirements You must have lived in [place], [place], or [place] counties for at least the last three months.   You cannot be eligible for state or federal sponsored [place], including [employer], [place], or [employer].   You generally cannot be eligible for healthcare insurance through your employer.    How to apply: Eligibility screenings are held every Tuesday and Wednesday afternoon from 1:00 pm until 4:00 pm. You do not need an appointment for the interview!   [facility] [address], [place], [place] [phone]   [facility] Health Department  [phone]   [facility] Department  [phone]   [facility] Health Department  [phone]    Behavioral Health Resources in the Community: Intensive Outpatient Programs Organization         Address  Phone  Notes  [facility] [address], [place], [place] [phone]   [facility] [address], [place], [place] [phone]   [facility] [address] [place], [place]  [phone]   [facility] Mental Health [address]  [place], [place] [phone] or [phone]   Substance Abuse Resources Organization         Address  Phone  Notes  [facility]  [phone]   [facility]  [phone]   [facility] [place]  [phone]   Floydene Flock  [phone]   Residential & Outpatient Substance Abuse Program  [phone]   Psychological Services Organization         Address  Phone  Notes  [facility]  [phone]   [facility]  [phone]   [facility] Mental Health [address], [place] [phone] or [phone]    Mobile Crisis Teams Organization         Address  Phone  Notes  [facility], Mobile Crisis Care Unit  [phone]   [facility]  [address] [place], [place] [phone]   Doristine Locks [address] [place] [phone]    Self-Help/Support Groups Organization         Address  Phone  Notes  [facility] [place] - variety of support groups  336- [phone] Call for more information  Narcotics Anonymous (NA), [facility] [address], [place]  2 meetings at this location   Statisticianesidential Treatment Programs Organization         Address  Phone  Notes  [facility] [address],    [place]  [phone]   [facility]  [address], [place] [phone], [place], [place] [phone]   [facility] [address], [place] [phone] Admissions: 8am-3pm M-F  [facility] [address]    [place], [place] [phone]   [facility] [address] [place] [address], [place], [place] [phone]   [facility] [address]  [place], [place] [phone]   [facility] - Intensive Outpatient [address], [place], [place] [phone]   [facility] ([facility] [address]  [place], [place] [phone] or [phone]   [facility] ([facility]) [address] [place], [place] [phone] Accepts Medicaid  [facility] [address]  [place] [phone] Substance Abuse/Addiction Treatment   [facility] Behavioral Health Resources Organization         Address  Phone  Notes  [facility]  [phone](888) [phone]   Angie FavaJulie Brannon, PhD [address] [place], [place]   [phone] or [phone](336) [phone]   [facility]   [address] [place], [place] [phone]) [phone]   [facility] [address], [place], [place] [phone]) [phone] Insurance/Medicaid/sponsorship through [facility]  [facility] Families [address]                                    [place], [place] [phone] Therapy/tele-psych/case  [facility] [address].   [place], [place] [phone]    Dr. Lolly MustacheArfeen  [phone](336) [phone]   [facility] [place] [facility]  [employer] [facility] 1) [address], [place] 2) [address], [place] 3)  [address], [place] [phone](336) [phone](336) [phone]  [phone](336) [phone]   [facility] Child Abuse Hotline [phone](336) [phone] or [phone](336) [phone] (After Hours)

## 2013-09-11 NOTE — Progress Notes (Signed)
Orthopedic Tech Progress Note Patient Details:  Edward FothergillBryan K Brewer 04/09/1990 [id number]  Ortho Devices Type of Ortho Device: Sling immobilizer Ortho Device/Splint Interventions: Application   Haskell Flirtewsome, Khrystyna Schwalm M 09/11/2013, 11:36 PM

## 2013-09-12 NOTE — ED Provider Notes (Signed)
Medical screening examination/treatment/procedure(s) were performed by non-physician practitioner and as supervising physician I was immediately available for consultation/collaboration.   EKG Interpretation None        Hillarie Harrigan, MD 09/12/13 1543 

## 2014-03-01 ENCOUNTER — Other Ambulatory Visit: Payer: Self-pay | Admitting: Neurology

## 2014-03-27 ENCOUNTER — Ambulatory Visit: Payer: Self-pay | Admitting: Nurse Practitioner

## 2014-04-17 ENCOUNTER — Ambulatory Visit (INDEPENDENT_AMBULATORY_CARE_PROVIDER_SITE_OTHER): Payer: BLUE CROSS/BLUE SHIELD | Admitting: Nurse Practitioner

## 2014-04-17 ENCOUNTER — Encounter: Payer: Self-pay | Admitting: Nurse Practitioner

## 2014-04-17 VITALS — BP 109/70 | HR 55 | Ht 69.0 in | Wt 157.8 lb

## 2014-04-17 DIAGNOSIS — G40309 Generalized idiopathic epilepsy and epileptic syndromes, not intractable, without status epilepticus: Secondary | ICD-10-CM

## 2014-04-17 DIAGNOSIS — Z5181 Encounter for therapeutic drug level monitoring: Secondary | ICD-10-CM

## 2014-04-17 MED ORDER — DIVALPROEX SODIUM ER 500 MG PO TB24: ORAL_TABLET | ORAL | Status: DC

## 2014-04-17 NOTE — Progress Notes (Signed)
I have read the note, and I agree with the clinical assessment and plan.  Edward Brewer,Edward Brewer   

## 2014-04-17 NOTE — Progress Notes (Signed)
[facility]  PATIENT: Edward FothergillBryan K Brewer DOB: 03/13/1990   REASON FOR VISIT: Follow-up for seizure disorder HISTORY FROM: Patient    HISTORY OF PRESENT ILLNESS:Edward Brewer is a 24 year old right-handed white male with a history of polysubstance abuse, ADD, and a history of seizures. The patient has been on Depakote, and generally in the past when he has had seizures, and he has come off of the medication. The patient indicates that he has not had a seizure in over 3 years. He was last seen in this office by Dr. Anne HahnWillis 04/15/2013. .The patient is currently driving without difficulty. 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 The patient is currently off this drug. The patient is taking Depakote only at this time and occasional ibuprofen. The patient returns for an evaluation.  REVIEW OF SYSTEMS: Full 14 system review of systems performed and notable only for those listed, all others are neg:  Constitutional: neg  Cardiovascular: neg Ear/Nose/Throat: neg  Skin: neg Eyes: neg Respiratory: neg Gastroitestinal: neg  Hematology/Lymphatic: neg  Endocrine: neg Musculoskeletal:neg Allergy/Immunology: neg Neurological: Seizure disorder  Psychiatric: neg Sleep : neg   ALLERGIES: No Known Allergies  HOME MEDICATIONS: Outpatient Prescriptions Prior to Visit  Medication Sig Dispense Refill  . divalproex (DEPAKOTE ER) 500 MG 24 hr tablet ONE TABLET IN THE MORNING AND TWO TABLETS IN THE EVENING 90 tablet 3  . ibuprofen (ADVIL,MOTRIN) 600 MG tablet Take 1 tablet (600 mg total) by mouth every 6 (six) hours as needed. (Patient not taking: Reported on 04/17/2014) 15 tablet 0  . divalproex (DEPAKOTE ER) 500 MG 24 hr tablet Take 500 mg by mouth 3 (three) times daily. One tablet in the morning and two tablets in the evening per patient     No facility-administered medications prior to visit.    PAST MEDICAL HISTORY: Past Medical  History  Diagnosis Date  . Epilepsy   . 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 ADD (attention deficit disorder)   . Polysubstance abuse     Heroin, marijuana abuse    PAST SURGICAL HISTORY: History reviewed. No pertinent past surgical history.  FAMILY HISTORY: Family History  Problem Relation Age of Onset  . Diabetes Father   . Seizures Neg Hx     SOCIAL HISTORY: History   Social History  . Marital Status: Single    Spouse Name: N/A  . Number of Children: 0  . Years of Education: hs   Occupational History  . unemployed    Social History Main Topics  . Smoking status: Current Every Day Smoker -- 1.00 packs/day  . Smokeless tobacco: Never Used  . Alcohol Use: Yes     Comment: occasional 6 beers a week  . Drug Use: Yes    Special: Marijuana, Heroin     Comment: clean at present  . Sexual Activity: Not on file   Other Topics Concern  . Not on file   Social History Narrative     PHYSICAL EXAM  Filed Vitals:   04/17/14 0922  BP: 109/70  Pulse: 55  Height: 5\' 9"  (1.753 m)  Weight: 157 lb 12.8 oz (71.578 kg)   Body mass index is 23.29 kg/(m^2).  Generalized: Well developed, in no acute distress  Head: normocephalic and atraumatic,. Oropharynx benign  Neck: Supple, no carotid bruits  Musculoskeletal: No deformity   Neurological examination   Mentation: Alert oriented to time, place, history taking. Attention span and  concentration appropriate. Recent and remote memory intact.  Follows all commands speech and language fluent.   Cranial nerve II-XII: Pupils were equal round reactive to light extraocular movements were full, visual field were full on confrontational test. Facial sensation and strength were normal. hearing was intact to finger rubbing bilaterally. Uvula tongue midline. head turning and shoulder shrug were normal and symmetric.Tongue protrusion into cheek strength was normal. Motor: normal bulk and  tone, full strength in the BUE, BLE, fine finger movements normal, no pronator drift. No focal weakness Sensory: normal and symmetric to light touch, pinprick, and  Vibration, proprioception  Coordination: finger-nose-finger, heel-to-shin bilaterally, no dysmetria Reflexes: Brachioradialis 2/2, biceps 2/2, triceps 2/2, patellar 2/2, Achilles 2/2, plantar responses were flexor bilaterally. Gait and Station: Rising up from seated position without assistance, normal stance,  moderate stride, good arm swing, smooth turning, able to perform tiptoe, and heel walking without difficulty. Tandem gait is steady  DIAGNOSTIC DATA (LABS, IMAGING, TESTING)   ASSESSMENT AND PLAN  24 y.o. year old male  has a past medical history of Epilepsy; Generalized convulsive epilepsy without mention of intractable epilepsy (04/15/2013); ADD (attention deficit disorder); and Polysubstance abuse. here to follow-up for his seizure disorder. Last seizure was 3 years ago.  Continue Depakote at current dose will refill Check labs today, CBC, CMP and VPA level Call for any seizure activity Follow-up yearly and when necessary Nilda Riggs, [facility], [facility], APRN  [facility] [address], [address] [place], [place] [address] [phone]

## 2014-04-17 NOTE — Patient Instructions (Signed)
Continue Depakote at current dose will refill Check labs today Call for any seizure activity Follow-up yearly and when necessary

## 2014-04-18 LAB — COMPREHENSIVE METABOLIC PANEL
A/G RATIO: 2 (ref 1.1–2.5)
ALBUMIN: 4.5 g/dL (ref 3.5–5.5)
ALT: 10 IU/L (ref 0–44)
AST: 20 IU/L (ref 0–40)
Alkaline Phosphatase: 83 IU/L (ref 39–117)
BUN / CREAT RATIO: 15 (ref 8–19)
BUN: 14 mg/dL (ref 6–20)
Bilirubin Total: 0.2 mg/dL (ref 0.0–1.2)
CALCIUM: 9.6 mg/dL (ref 8.7–10.2)
CHLORIDE: 101 mmol/L (ref 97–108)
CO2: 25 mmol/L (ref 18–29)
CREATININE: 0.91 mg/dL (ref 0.76–1.27)
GFR calc Af Amer: 135 mL/min/{1.73_m2} (ref >59)
GFR calc non Af Amer: 117 mL/min/{1.73_m2} (ref >59)
GLOBULIN, TOTAL: 2.3 g/dL (ref 1.5–4.5)
Glucose: 80 mg/dL (ref 65–99)
Potassium: 4.5 mmol/L (ref 3.5–5.2)
SODIUM: 139 mmol/L (ref 134–144)
Total Protein: 6.8 g/dL (ref 6.0–8.5)

## 2014-04-18 LAB — CBC WITH DIFFERENTIAL/PLATELET
BASOS: 0 %
Basophils Absolute: 0 10*3/uL (ref 0.0–0.2)
EOS: 2 %
Eosinophils Absolute: 0.1 10*3/uL (ref 0.0–0.4)
HCT: 46 % (ref 37.5–51.0)
HEMOGLOBIN: 15.9 g/dL (ref 12.6–17.7)
IMMATURE GRANS (ABS): 0 10*3/uL (ref 0.0–0.1)
Immature Granulocytes: 0 %
LYMPHS ABS: 1.8 10*3/uL (ref 0.7–3.1)
Lymphs: 41 %
MCH: 31.7 pg (ref 26.6–33.0)
MCHC: 34.6 g/dL (ref 31.5–35.7)
MCV: 92 fL (ref 79–97)
MONOS ABS: 0.3 10*3/uL (ref 0.1–0.9)
Monocytes: 6 %
Neutrophils Absolute: 2.2 10*3/uL (ref 1.4–7.0)
Neutrophils Relative %: 51 %
Platelets: 229 10*3/uL (ref 150–379)
RBC: 5.01 x10E6/uL (ref 4.14–5.80)
RDW: 12.9 % (ref 12.3–15.4)
WBC: 4.3 10*3/uL (ref 3.4–10.8)

## 2014-04-18 LAB — VALPROIC ACID LEVEL: Valproic Acid Lvl: 84 ug/mL (ref 50–100)

## 2014-04-19 ENCOUNTER — Telehealth: Payer: Self-pay

## 2014-04-19 NOTE — Telephone Encounter (Signed)
-----   Message from Lynder ParentsNancy C Martin, NP sent at 04/18/2014 12:38 PM EST ----- Labs look good. Please call the patient

## 2014-04-19 NOTE — Telephone Encounter (Signed)
Spoke to patient. Gave lab results. Patient verbalized understanding.  

## 2015-04-17 ENCOUNTER — Encounter: Payer: Self-pay | Admitting: Nurse Practitioner

## 2015-04-17 ENCOUNTER — Ambulatory Visit (INDEPENDENT_AMBULATORY_CARE_PROVIDER_SITE_OTHER): Payer: BLUE CROSS/BLUE SHIELD | Admitting: Nurse Practitioner

## 2015-04-17 VITALS — BP 120/77 | HR 69 | Ht 69.0 in | Wt 152.8 lb

## 2015-04-17 DIAGNOSIS — Z5181 Encounter for therapeutic drug level monitoring: Secondary | ICD-10-CM | POA: Insufficient documentation

## 2015-04-17 DIAGNOSIS — G40309 Generalized idiopathic epilepsy and epileptic syndromes, not intractable, without status epilepticus: Secondary | ICD-10-CM

## 2015-04-17 NOTE — Progress Notes (Signed)
I have read the note, and I agree with the clinical assessment and plan.  WILLIS,CHARLES KEITH   

## 2015-04-17 NOTE — Patient Instructions (Signed)
Continue Depakote at current dose will refill Check labs today, CBC, CMP and VPA level Call for any seizure activity Follow-up yearly and when necessary
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
## 2015-04-18 ENCOUNTER — Other Ambulatory Visit: Payer: Self-pay | Admitting: Nurse Practitioner

## 2015-04-18 LAB — COMPREHENSIVE METABOLIC PANEL
ALBUMIN: 4.1 g/dL (ref 3.5–5.5)
ALT: 13 IU/L (ref 0–44)
AST: 15 IU/L (ref 0–40)
Albumin/Globulin Ratio: 1.8 (ref 1.1–2.5)
Alkaline Phosphatase: 69 IU/L (ref 39–117)
BUN / CREAT RATIO: 14 (ref 8–19)
BUN: 11 mg/dL (ref 6–20)
Bilirubin Total: 0.3 mg/dL (ref 0.0–1.2)
CALCIUM: 9.1 mg/dL (ref 8.7–10.2)
CHLORIDE: 102 mmol/L (ref 96–106)
CO2: 26 mmol/L (ref 18–29)
CREATININE: 0.81 mg/dL (ref 0.76–1.27)
GFR calc non Af Amer: 123 mL/min/{1.73_m2} (ref >59)
GFR, EST AFRICAN AMERICAN: 142 mL/min/{1.73_m2} (ref >59)
Globulin, Total: 2.3 g/dL (ref 1.5–4.5)
Glucose: 81 mg/dL (ref 65–99)
Potassium: 4.6 mmol/L (ref 3.5–5.2)
Sodium: 142 mmol/L (ref 134–144)
TOTAL PROTEIN: 6.4 g/dL (ref 6.0–8.5)

## 2015-04-18 LAB — CBC WITH DIFFERENTIAL/PLATELET
BASOS ABS: 0 10*3/uL (ref 0.0–0.2)
Basos: 1 %
EOS (ABSOLUTE): 0.1 10*3/uL (ref 0.0–0.4)
EOS: 2 %
HEMATOCRIT: 42.5 % (ref 37.5–51.0)
HEMOGLOBIN: 14.3 g/dL (ref 12.6–17.7)
IMMATURE GRANS (ABS): 0 10*3/uL (ref 0.0–0.1)
IMMATURE GRANULOCYTES: 0 %
LYMPHS ABS: 1.1 10*3/uL (ref 0.7–3.1)
LYMPHS: 32 %
MCH: 31.8 pg (ref 26.6–33.0)
MCHC: 33.6 g/dL (ref 31.5–35.7)
MCV: 95 fL (ref 79–97)
MONOCYTES: 6 %
Monocytes Absolute: 0.2 10*3/uL (ref 0.1–0.9)
NEUTROS PCT: 59 %
Neutrophils Absolute: 2.1 10*3/uL (ref 1.4–7.0)
Platelets: 232 10*3/uL (ref 150–379)
RBC: 4.49 x10E6/uL (ref 4.14–5.80)
RDW: 13.3 % (ref 12.3–15.4)
WBC: 3.6 10*3/uL (ref 3.4–10.8)

## 2015-04-18 LAB — VALPROIC ACID LEVEL: VALPROIC ACID LVL: 86 ug/mL (ref 50–100)

## 2015-04-18 MED ORDER — DIVALPROEX SODIUM ER 500 MG PO TB24: ORAL_TABLET | ORAL | Status: DC

## 2015-10-07 ENCOUNTER — Encounter (HOSPITAL_COMMUNITY): Payer: Self-pay | Admitting: Emergency Medicine

## 2015-10-07 DIAGNOSIS — F909 Attention-deficit hyperactivity disorder, unspecified type: Secondary | ICD-10-CM | POA: Insufficient documentation

## 2015-10-07 DIAGNOSIS — F17200 Nicotine dependence, unspecified, uncomplicated: Secondary | ICD-10-CM | POA: Insufficient documentation

## 2015-10-07 DIAGNOSIS — K047 Periapical abscess without sinus: Secondary | ICD-10-CM | POA: Insufficient documentation

## 2015-10-07 NOTE — ED Triage Notes (Signed)
Pt from home with complaints of bottom right toothache. Pt states it began last night, but has gotten increasingly worse. Pt states swelling has increased in his jaw as well. Pt has slight swelling in this area. Pt states he does not have a dentist, bout would like resources. Pt states he has taken motrin today for pain without relief, he has taken 15 200 mg tablets throughout the day, the last dose of which was around 2200. Pt denies fever or chills.

## 2015-10-08 ENCOUNTER — Emergency Department (HOSPITAL_COMMUNITY)
Admission: EM | Admit: 2015-10-08 | Discharge: 2015-10-08 | Disposition: A | Discharge status: Home / Self Care | Payer: Self-pay | Attending: Emergency Medicine | Admitting: Emergency Medicine

## 2015-10-08 DIAGNOSIS — K047 Periapical abscess without sinus: Secondary | ICD-10-CM

## 2015-10-08 MED ORDER — AMOXICILLIN 500 MG PO CAPS: 500.0000 mg | ORAL_CAPSULE | Freq: Three times a day (TID) | ORAL | 0 refills | Status: DC

## 2015-10-08 MED ORDER — IBUPROFEN 600 MG PO TABS: 600.0000 mg | ORAL_TABLET | Freq: Four times a day (QID) | ORAL | 0 refills | Status: DC | PRN

## 2015-10-08 MED ORDER — OXYCODONE-ACETAMINOPHEN 5-325 MG PO TABS
1.0000 | ORAL_TABLET | Freq: Once | ORAL | Status: AC
  Administered 2015-10-08: 1 via ORAL
  Filled 2015-10-08: qty 1

## 2015-10-08 MED ORDER — HYDROCODONE-ACETAMINOPHEN 5-325 MG PO TABS: 1.0000 | ORAL_TABLET | ORAL | 0 refills | Status: DC | PRN

## 2015-10-08 MED ORDER — AMOXICILLIN 500 MG PO CAPS
500.0000 mg | ORAL_CAPSULE | Freq: Once | ORAL | Status: AC
  Administered 2015-10-08: 500 mg via ORAL
  Filled 2015-10-08: qty 1

## 2015-10-08 NOTE — Discharge Instructions (Signed)
Take ibuprofen for pain, only take as prescribed. Take Norco for severe pain. Try warm compresses and mouth rinses several times a day. Take amoxicillin as prescribed until all gone. Follow up with a dentist, call the dentist on call tomorrow to schedule an appointment for next week.

## 2015-10-08 NOTE — ED Provider Notes (Signed)
[facility]-EMERGENCY DEPT Provider Note   CSN: [id number] Arrival date & time: 10/07/15  2235  By signing my name below, I, Edward Brewer, attest that this documentation has been prepared under the direction and in the presence of Aldean Suddeth, PA-C. Electronically Signed: Gillis EndsJasmyn B. Lyn HollingsheadAlexander, ED Scribe. 10/08/15. 12:30 AM.  History   Chief Complaint Chief Complaint  Patient presents with  . Dental Pain    HPI HPI Comments: Edward Brewer is a 25 y.o. male who presents to the Emergency Department complaining of sudden onset, gradually worsening, constant, radiating, lower right-sided dental pain to right jaw x 1 day. He has associated facial swelling. He notes having a partial root canal about 1 year ago in the same area and has not had any issues until current complaint. He does not have a dentist. He has taken Motrin for pain with last dose being at 2200 with no relief of symptoms. Denies any fevers or chills.   The history is provided by the patient. No language interpreter was used.    Past Medical History:  Diagnosis Date  . ADD (attention deficit disorder)   . Epilepsy (HCC)   . 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 Polysubstance abuse    Heroin, marijuana abuse    Patient Active Problem List   Diagnosis Date Noted  . Encounter for therapeutic drug monitoring 04/17/2015  . Generalized convulsive epilepsy (HCC) 04/15/2013   History reviewed. No pertinent surgical history.   Home Medications    Prior to Admission medications   Medication Sig Start Date End Date Taking? Authorizing Provider  divalproex (DEPAKOTE ER) 500 MG 24 hr tablet ONE TABLET IN THE MORNING AND TWO TABLETS IN THE EVENING 04/18/15   Nilda RiggsNancy Carolyn Martin, NP    Family History Family History  Problem Relation Age of Onset  . Diabetes Father   . Seizures Neg Hx     Social History Social History  Substance Use Topics  . Smoking status:  Current Every Day Smoker    Packs/day: 1.00  . Smokeless tobacco: Never Used  . Alcohol use No     Comment: occasional 6 beers a week     Allergies   Review of patient's allergies indicates no known allergies.   Review of Systems Review of Systems  Constitutional: Negative for chills and fever.  HENT: Positive for dental problem and facial swelling.   All other systems reviewed and are negative.  Physical Exam Updated Vital Signs BP (!) 154/102 (BP Location: Left Arm)   Pulse (!) 58   Temp 98.7 F (37.1 C) (Oral)   Resp 16   SpO2 99%   Physical Exam  Constitutional: He is oriented to person, place, and time. He appears well-developed and well-nourished. No distress.  HENT:  Head: Normocephalic and atraumatic.  Mouth/Throat: Oropharynx is clear and moist.  Right lower facial swelling. Dental abscess present over right lower first molar and first premolar. Tender to palpation. No trismus. No swelling under the tongue.  Eyes: Conjunctivae are normal.  Cardiovascular: Normal rate.   Pulmonary/Chest: Effort normal.  Abdominal: He exhibits no distension.  Neurological: He is alert and oriented to person, place, and time.  Skin: Skin is warm and dry.  Psychiatric: He has a normal mood and affect.  Nursing note and vitals reviewed.  ED Treatments / Results  DIAGNOSTIC STUDIES: Oxygen Saturation is 99% on RA, normal by my interpretation.    COORDINATION OF CARE: 12:26 AM-Discussed treatment plan which  includes pain management instructions and order of  Percocet and Amoxicillin with pt at bedside and pt agreed to plan.  Procedures Procedures (including critical care time)  Medications Ordered in ED Medications  oxyCODONE-acetaminophen (PERCOCET/ROXICET) 5-325 MG per tablet 1 tablet (not administered)  amoxicillin (AMOXIL) capsule 500 mg (not administered)   Initial Impression / Assessment and Plan / ED Course  I have reviewed the triage vital signs and the nursing  notes.  Pertinent labs & imaging results that were available during my care of the patient were reviewed by me and considered in my medical decision making (see chart for details).  Clinical Course    Patient with dental abscess, onset today. Afebrile. No evidence of Ludwig's angina. Will start amoxicillin, ibuprofen and Norco for severe pain. Will give follow-up with the dentist. Return precautions discussed.  Vitals:   10/07/15 2243  BP: (!) 154/102  Pulse: (!) 58  Resp: 16  Temp: 98.7 F (37.1 C)  TempSrc: Oral  SpO2: 99%     Final Clinical Impressions(s) / ED Diagnoses   Final diagnoses:  Dental abscess    New Prescriptions New Prescriptions   No medications on file   I personally performed the services described in this documentation, which was scribed in my presence. The recorded information has been reviewed and is accurate.    Jaynie Crumble, PA-C 10/08/15 0040    Melene Plan, DO 10/08/15 0045

## 2015-10-08 NOTE — ED Notes (Signed)
Bed: WA06 Expected date:  Expected time:  Means of arrival:  Comments: 

## 2016-01-29 ENCOUNTER — Encounter (HOSPITAL_COMMUNITY): Payer: Self-pay

## 2016-01-29 ENCOUNTER — Emergency Department (HOSPITAL_COMMUNITY)
Admission: EM | Admit: 2016-01-29 | Discharge: 2016-01-29 | Disposition: A | Discharge status: Home / Self Care | Payer: Self-pay | Attending: Emergency Medicine | Admitting: Emergency Medicine

## 2016-01-29 DIAGNOSIS — F909 Attention-deficit hyperactivity disorder, unspecified type: Secondary | ICD-10-CM | POA: Insufficient documentation

## 2016-01-29 DIAGNOSIS — Y929 Unspecified place or not applicable: Secondary | ICD-10-CM | POA: Insufficient documentation

## 2016-01-29 DIAGNOSIS — Y990 Civilian activity done for income or pay: Secondary | ICD-10-CM | POA: Insufficient documentation

## 2016-01-29 DIAGNOSIS — F17200 Nicotine dependence, unspecified, uncomplicated: Secondary | ICD-10-CM | POA: Insufficient documentation

## 2016-01-29 DIAGNOSIS — S60861A Insect bite (nonvenomous) of right wrist, initial encounter: Secondary | ICD-10-CM | POA: Insufficient documentation

## 2016-01-29 DIAGNOSIS — Z79899 Other long term (current) drug therapy: Secondary | ICD-10-CM | POA: Insufficient documentation

## 2016-01-29 DIAGNOSIS — Y9389 Activity, other specified: Secondary | ICD-10-CM | POA: Insufficient documentation

## 2016-01-29 DIAGNOSIS — W57XXXA Bitten or stung by nonvenomous insect and other nonvenomous arthropods, initial encounter: Secondary | ICD-10-CM | POA: Insufficient documentation

## 2016-01-29 NOTE — ED Triage Notes (Signed)
Pt was cleaning out a shed today and felt something sting his right wrist, he has a whelp and red area in that spot, he states that his hand and fingers on that side felt numb earlier

## 2016-01-29 NOTE — Discharge Instructions (Signed)
Take over the counter benadryl to help with the itching and the rash.  You can take ibuprofen or naprosyn as needed for pain

## 2016-01-30 NOTE — ED Provider Notes (Signed)
[facility]-EMERGENCY DEPT Provider Note   CSN: [id number] Arrival date & time: 01/29/16  2011     History   Chief Complaint Chief Complaint  Patient presents with  . Insect Bite    HPI Edward Brewer is a 25 y.o. male.  HPI Pt was cleaning out a shed today while working when he felt something sting his left wrist.  He did not see an insect or notice a bite but it felt like something bit him.  He started developing a pruritic rash that was red and raised.  Initially he had a sharp pain but that quickly resolved.  No fever.  No other systemic sx, shortness of breath etc. Past Medical History:  Diagnosis Date  . ADD (attention deficit disorder)   . Epilepsy (HCC)   . 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 Polysubstance abuse    Heroin, marijuana abuse    Patient Active Problem List   Diagnosis Date Noted  . Encounter for therapeutic drug monitoring 04/17/2015  . 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 No pertinent surgical history.     Home Medications    Prior to Admission medications   Medication Sig Start Date End Date Taking? Authorizing Provider  amoxicillin (AMOXIL) 500 MG capsule Take 1 capsule (500 mg total) by mouth 3 (three) times daily. 10/08/15   Tatyana Kirichenko, PA-C  divalproex (DEPAKOTE ER) 500 MG 24 hr tablet ONE TABLET IN THE MORNING AND TWO TABLETS IN THE EVENING 04/18/15   Nilda RiggsNancy Carolyn Martin, NP  HYDROcodone-acetaminophen [facility](NORCO) 5-325 MG tablet Take 1-2 tablets by mouth every 4 (four) hours as needed for moderate pain. 10/08/15   Tatyana Kirichenko, PA-C  ibuprofen (ADVIL,MOTRIN) 600 MG tablet Take 1 tablet (600 mg total) by mouth every 6 (six) hours as needed. 10/08/15   Jaynie Crumbleatyana Kirichenko, PA-C    Family History Family History  Problem Relation Age of Onset  . Diabetes Father   . Seizures Neg Hx     Social History Social History  Substance Use Topics  . Smoking status:  Current Every Day Smoker    Packs/day: 1.00  . Smokeless tobacco: Never Used  . Alcohol use No     Comment: occasional 6 beers a week     Allergies   Patient has no known allergies.   Review of Systems Review of Systems  All other systems reviewed and are negative.    Physical Exam Updated Vital Signs BP 120/74 (BP Location: Left Arm)   Pulse 91   Temp 97.7 F (36.5 C) (Oral)   Resp 20   SpO2 99%   Physical Exam  Constitutional: He appears well-developed and well-nourished. No distress.  HENT:  Head: Normocephalic and atraumatic.  Right Ear: External ear normal.  Left Ear: External ear normal.  Eyes: Conjunctivae are normal. Right eye exhibits no discharge. Left eye exhibits no discharge. No scleral icterus.  Neck: Neck supple. No tracheal deviation present.  Cardiovascular: Normal rate.   Pulmonary/Chest: Effort normal. No stridor. No respiratory distress.  Abdominal: He exhibits no distension.  Musculoskeletal: He exhibits no edema.  Neurological: He is alert. Cranial nerve deficit: no gross deficits.  Skin: Skin is warm and dry. Rash noted.  Erythematous raised, urticaria rash volar aspect of left wrist, no lymphangitic streaking  Psychiatric: He has a normal mood and affect.  Nursing note and vitals reviewed.    ED Treatments / Results     Procedures Procedures (including critical care  time)  Medications Ordered in ED Medications - No data to display Pt did not want any medications in the ED.  He was concerned about possible cost issues.  Initial Impression / Assessment and Plan / ED Course  I have reviewed the triage vital signs and the nursing notes.  Pertinent labs & imaging results that were available during my care of the patient were reviewed by me and considered in my medical decision making (see chart for details).   Suspect the sx are related to some type of insect bite/sting.  ?spider.  No clear puncture wound.  Doubt cellulitis infection  with the rapid onset.  No systemic sx.   Take antihistamines/nsaids PRN.  Pt comfortable with plan.  WIll pick up meds at a pharmacy. Clinical Course     Final Clinical Impressions(s) / ED Diagnoses   Final diagnoses:  Insect bite, initial encounter    New Prescriptions Discharge Medication List as of 01/29/2016  9:56 PM       Linwood DibblesJon Miyana Mordecai, MD 01/30/16 1807

## 2016-04-16 ENCOUNTER — Ambulatory Visit (INDEPENDENT_AMBULATORY_CARE_PROVIDER_SITE_OTHER): Payer: Self-pay | Admitting: Nurse Practitioner

## 2016-04-16 ENCOUNTER — Encounter: Payer: Self-pay | Admitting: Nurse Practitioner

## 2016-04-16 VITALS — BP 105/66 | HR 58 | Resp 14 | Ht 69.0 in | Wt 172.2 lb

## 2016-04-16 DIAGNOSIS — Z5181 Encounter for therapeutic drug level monitoring: Secondary | ICD-10-CM

## 2016-04-16 DIAGNOSIS — G40309 Generalized idiopathic epilepsy and epileptic syndromes, not intractable, without status epilepticus: Secondary | ICD-10-CM

## 2016-04-16 NOTE — Progress Notes (Signed)
I have read the note, and I agree with the clinical assessment and plan.  Naijah Lacek KEITH   

## 2016-04-16 NOTE — Patient Instructions (Signed)
Continue Depakote at current dose will refill once labs back Check labs today, CBC, CMP and VPA level today Call for any seizure activity Follow-up yearly and when necessary
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
## 2016-04-17 ENCOUNTER — Telehealth: Payer: Self-pay | Admitting: Nurse Practitioner

## 2016-04-17 LAB — COMPREHENSIVE METABOLIC PANEL
A/G RATIO: 1.9 (ref 1.2–2.2)
ALT: 9 IU/L (ref 0–44)
AST: 20 IU/L (ref 0–40)
Albumin: 4.6 g/dL (ref 3.5–5.5)
Alkaline Phosphatase: 74 IU/L (ref 39–117)
BILIRUBIN TOTAL: 0.4 mg/dL (ref 0.0–1.2)
BUN/Creatinine Ratio: 15 (ref 9–20)
BUN: 11 mg/dL (ref 6–20)
CALCIUM: 9.6 mg/dL (ref 8.7–10.2)
CHLORIDE: 98 mmol/L (ref 96–106)
CO2: 25 mmol/L (ref 18–29)
Creatinine, Ser: 0.74 mg/dL — ABNORMAL LOW (ref 0.76–1.27)
GFR, EST AFRICAN AMERICAN: 146 mL/min/{1.73_m2} (ref >59)
GFR, EST NON AFRICAN AMERICAN: 126 mL/min/{1.73_m2} (ref >59)
GLOBULIN, TOTAL: 2.4 g/dL (ref 1.5–4.5)
Glucose: 63 mg/dL — ABNORMAL LOW (ref 65–99)
POTASSIUM: 4.9 mmol/L (ref 3.5–5.2)
SODIUM: 139 mmol/L (ref 134–144)
Total Protein: 7 g/dL (ref 6.0–8.5)

## 2016-04-17 LAB — CBC WITH DIFFERENTIAL/PLATELET
BASOS: 1 %
Basophils Absolute: 0 10*3/uL (ref 0.0–0.2)
EOS (ABSOLUTE): 0.2 10*3/uL (ref 0.0–0.4)
EOS: 4 %
HEMATOCRIT: 46.7 % (ref 37.5–51.0)
Hemoglobin: 15.5 g/dL (ref 13.0–17.7)
Immature Grans (Abs): 0 10*3/uL (ref 0.0–0.1)
Immature Granulocytes: 0 %
LYMPHS ABS: 1.5 10*3/uL (ref 0.7–3.1)
Lymphs: 35 %
MCH: 30.5 pg (ref 26.6–33.0)
MCHC: 33.2 g/dL (ref 31.5–35.7)
MCV: 92 fL (ref 79–97)
MONOS ABS: 0.3 10*3/uL (ref 0.1–0.9)
Monocytes: 8 %
NEUTROS PCT: 52 %
Neutrophils Absolute: 2.2 10*3/uL (ref 1.4–7.0)
PLATELETS: 261 10*3/uL (ref 150–379)
RBC: 5.09 x10E6/uL (ref 4.14–5.80)
RDW: 13.5 % (ref 12.3–15.4)
WBC: 4.2 10*3/uL (ref 3.4–10.8)

## 2016-04-17 LAB — VALPROIC ACID LEVEL: Valproic Acid Lvl: 29 ug/mL — ABNORMAL LOW (ref 50–100)

## 2016-04-17 MED ORDER — DIVALPROEX SODIUM ER 500 MG PO TB24: 1000.0000 mg | ORAL_TABLET | Freq: Two times a day (BID) | ORAL | 6 refills | Status: DC

## 2016-04-17 NOTE — Telephone Encounter (Signed)
Rn call patient phone again. Rn stated per Carolyn(NP) notes his depakote level was low. He needs to increase to 500mg  2 tablets twice a day. Medication has been sent to [facility] [address] PT verbalized understanding and new instructions and increase in dosage.

## 2016-04-17 NOTE — Telephone Encounter (Signed)
Rn tried to call patient about depakote change. Unable to leave message.

## 2016-04-17 NOTE — Telephone Encounter (Signed)
Depakote  level too low, these increase to 500 mg 2 tablets twice a day. This has already been called to your pharmacy. Repeat Depakote level in 2 weeks

## 2016-07-04 ENCOUNTER — Emergency Department (HOSPITAL_COMMUNITY)
Admission: EM | Admit: 2016-07-04 | Discharge: 2016-07-05 | Disposition: A | Discharge status: Home / Self Care | Payer: Self-pay | Attending: Emergency Medicine | Admitting: Emergency Medicine

## 2016-07-04 ENCOUNTER — Encounter (HOSPITAL_COMMUNITY): Payer: Self-pay

## 2016-07-04 DIAGNOSIS — F17210 Nicotine dependence, cigarettes, uncomplicated: Secondary | ICD-10-CM | POA: Insufficient documentation

## 2016-07-04 DIAGNOSIS — J029 Acute pharyngitis, unspecified: Secondary | ICD-10-CM | POA: Insufficient documentation

## 2016-07-04 DIAGNOSIS — Z79899 Other long term (current) drug therapy: Secondary | ICD-10-CM | POA: Insufficient documentation

## 2016-07-04 DIAGNOSIS — K047 Periapical abscess without sinus: Secondary | ICD-10-CM | POA: Insufficient documentation

## 2016-07-04 LAB — RAPID STREP SCREEN (MED CTR MEBANE ONLY): Streptococcus, Group A Screen (Direct): NEGATIVE

## 2016-07-04 MED ORDER — MAGIC MOUTHWASH
5.0000 mL | Freq: Once | ORAL | Status: AC
  Administered 2016-07-04: 5 mL via ORAL
  Filled 2016-07-04: qty 5

## 2016-07-04 MED ORDER — CLINDAMYCIN HCL 150 MG PO CAPS: 450.0000 mg | ORAL_CAPSULE | Freq: Three times a day (TID) | ORAL | 0 refills | Status: AC

## 2016-07-04 MED ORDER — PREDNISONE 20 MG PO TABS
60.0000 mg | ORAL_TABLET | Freq: Once | ORAL | Status: AC
  Administered 2016-07-04: 60 mg via ORAL
  Filled 2016-07-04: qty 3

## 2016-07-04 MED ORDER — PREDNISONE 10 MG PO TABS: 40.0000 mg | ORAL_TABLET | Freq: Every day | ORAL | 0 refills | Status: AC

## 2016-07-04 MED ORDER — MAGIC MOUTHWASH: 5.0000 mL | Freq: Three times a day (TID) | ORAL | 0 refills | Status: DC | PRN

## 2016-07-04 NOTE — ED Provider Notes (Signed)
[facility]-EMERGENCY DEPT Provider Note   CSN: [id number] Arrival date & time: 07/04/16  2101     History   Chief Complaint Chief Complaint  Patient presents with  . Sore Throat    HPI Edward Brewer is a 26 y.o. male presenting with sore throat for the past week. He reports associated voice changes a few days ago that have now resolved. He endorses pain with swallowing and talking, but has been able to eat and drink today. Also has had dry cough and scratching feeling today. No dental pain, but he reports hx of dental abscesses. He has one small raised area on his left upper gingiva which is non-painful. Denies fever, nausea, vomiting, shortness of breath or other symptoms.  HPI  Past Medical History:  Diagnosis Date  . ADD (attention deficit disorder)   . Epilepsy (HCC)   . 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 Polysubstance abuse    Heroin, marijuana abuse    Patient Active Problem List   Diagnosis Date Noted  . Encounter for therapeutic drug monitoring 04/17/2015  . 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 No pertinent surgical history.     Home Medications    Prior to Admission medications   Medication Sig Start Date End Date Taking? Authorizing Provider  amoxicillin (AMOXIL) 500 MG capsule Take 1 capsule (500 mg total) by mouth 3 (three) times daily. Patient not taking: Reported on 04/16/2016 10/08/15   Jaynie CrumbleKirichenko, Tatyana, PA-C  clindamycin (CLEOCIN) 150 MG capsule Take 3 capsules (450 mg total) by mouth 3 (three) times daily. 07/04/16 07/11/16  Mathews RobinsonsMitchell, Shekelia Boutin B, PA-C  divalproex (DEPAKOTE ER) 500 MG 24 hr tablet Take 2 tablets (1,000 mg total) by mouth 2 (two) times daily. 04/17/16   Nilda RiggsMartin, Nancy Carolyn, NP  HYDROcodone-acetaminophen (NORCO) 5-325 MG tablet Take 1-2 tablets by mouth every 4 (four) hours as needed for moderate pain. Patient not taking: Reported on 04/16/2016 10/08/15   Jaynie CrumbleKirichenko,  Tatyana, PA-C  ibuprofen (ADVIL,MOTRIN) 600 MG tablet Take 1 tablet (600 mg total) by mouth every 6 (six) hours as needed. Patient not taking: Reported on 04/16/2016 10/08/15   Jaynie CrumbleKirichenko, Tatyana, PA-C  magic mouthwash SOLN Take 5 mLs by mouth 3 (three) times daily as needed for mouth pain. 07/04/16   Georgiana ShoreMitchell, Gerhart Ruggieri B, PA-C  predniSONE (DELTASONE) 10 MG tablet Take 4 tablets (40 mg total) by mouth daily. 07/04/16 07/08/16  Georgiana ShoreMitchell, Marlea Gambill B, PA-C    Family History Family History  Problem Relation Age of Onset  . Diabetes Father   . Seizures Neg Hx     Social History Social History  Substance Use Topics  . Smoking status: Current Every Day Smoker    Packs/day: 1.00  . Smokeless tobacco: Never Used  . Alcohol use No     Comment: occasional 6 beers a week     Allergies   Patient has no known allergies.   Review of Systems Review of Systems  Constitutional: Positive for chills. Negative for fever.  HENT: Positive for sore throat, trouble swallowing and voice change. Negative for congestion, dental problem, drooling and facial swelling.   Respiratory: Positive for cough. Negative for choking, chest tightness, shortness of breath, wheezing and stridor.   Cardiovascular: Negative for chest pain.  Gastrointestinal: Negative for nausea and vomiting.  Musculoskeletal: Negative for neck pain and neck stiffness.  Skin: Negative for color change, pallor and rash.  Neurological: Negative for dizziness, facial asymmetry, speech difficulty, weakness, light-headedness  and headaches.     Physical Exam Updated Vital Signs BP 128/88 (BP Location: Left Arm)   Pulse 73   Temp 99.7 F (37.6 C) (Oral)   Resp 18   Wt 71.8 kg (158 lb 3.2 oz)   SpO2 100%   BMI 23.36 kg/m   Physical Exam  Constitutional: He appears well-developed and well-nourished. No distress.  Afebrile, non-toxic appearing, sitting comfortably in chair in no acute distress.  HENT:  Head: Normocephalic and atraumatic.   Mouth/Throat: No oropharyngeal exudate.  No gross oropharangeal abscess. Small raised area of left upper gingiva, non-tender, no drainage, doesn't appear amenabal to I&D. Uvula is midline, arches are simmetrical and intact. No peritonsilar swelling or exudate. No trismus. Sublingual mucosa is soft and non-tender. Tolerating oral secretions. No concern for ludwig's angina.  Eyes: Conjunctivae and EOM are normal.  Neck: Normal range of motion. Neck supple.  Cardiovascular: Normal rate, regular rhythm and normal heart sounds.   No murmur heard. Pulmonary/Chest: Effort normal and breath sounds normal. No stridor. No respiratory distress. He has no wheezes. He has no rales.  Abdominal: He exhibits no distension.  Musculoskeletal: He exhibits no edema.  Lymphadenopathy:    He has cervical adenopathy.  Neurological: He is alert.  Skin: Skin is warm and dry. No rash noted. He is not diaphoretic. No erythema. No pallor.  Psychiatric: He has a normal mood and affect.  Nursing note and vitals reviewed.    ED Treatments / Results  Labs (all labs ordered are listed, but only abnormal results are displayed) Labs Reviewed  RAPID STREP SCREEN (NOT AT [facility])  CULTURE, GROUP A STREP [facility])    EKG  EKG Interpretation None       Radiology No results found.  Procedures Procedures (including critical care time)  Medications Ordered in ED Medications  predniSONE (DELTASONE) tablet 60 mg (not administered)  magic mouthwash (not administered)     Initial Impression / Assessment and Plan / ED Course  I have reviewed the triage vital signs and the nursing notes.  Pertinent labs & imaging results that were available during my care of the patient were reviewed by me and considered in my medical decision making (see chart for details).    Patient presents with sore throat for the past week. Erythema to the oropharynx.  Pt afebrile without tonsillar exudate, negative strep. Presents with mild  cervical lymphadenopathy, & dysphagia; diagnosis of viral pharyngitis. Patient also has small potential abscess of gingiva and poor dental hygiene. Multiple missing teeth and decay.   DC w symptomatic tx for pain  Pt does not appear dehydrated, but did discuss importance of water rehydration. Presentation non concerning for PTA or infxn spread to soft tissue. No trismus or uvula deviation. Specific return precautions discussed. Pt able to drink water in ED without difficulty with intact airway. Recommended PCP and dental follow up.  Exam unconcerning for Ludwig's angina or spread of infection.  Will treat with abx and pain medicine.  Urged patient to follow-up with dentist.    Discussed strict return precautions and advised to return to the emergency department if experiencing any new or worsening symptoms. Instructions were understood and patient agreed with discharge plan. Final Clinical Impressions(s) / ED Diagnoses   Final diagnoses:  Pharyngitis, unspecified etiology  Abscess, dental    New Prescriptions New Prescriptions   CLINDAMYCIN (CLEOCIN) 150 MG CAPSULE    Take 3 capsules (450 mg total) by mouth 3 (three) times daily.   MAGIC MOUTHWASH SOLN  Take 5 mLs by mouth 3 (three) times daily as needed for mouth pain.   PREDNISONE (DELTASONE) 10 MG TABLET    Take 4 tablets (40 mg total) by mouth daily.     Georgiana Shore, PA-C 07/04/16 2347    Doug Sou, MD 07/05/16 Georgiann Mohs

## 2016-07-04 NOTE — ED Notes (Signed)
Pt tolerating PO fluids well

## 2016-07-04 NOTE — Discharge Instructions (Signed)
As discussed, take your antibiotic as prescribed for the next 7 days even if you feel better. Follow up with a dentist as soon as possible. Mouthwash and prednisone for the next 4 days. Slowly start reintroducing foods starting with broths.  Return to the ER immediately if your experience difficulty, breathing, cannot swallow, swelling in your throat or other concerning symptoms in the meantime.

## 2016-07-04 NOTE — ED Triage Notes (Signed)
Pt reports throat pain since Saturday. He reports that it is painful to swallow, but he was able to tolerate a small amount of fluid today. Pt is speaking in complete sentences and no airway compromise is noted. Denies fever, vomiting, or any other symptoms. A&Ox4.

## 2016-07-07 LAB — CULTURE, GROUP A STREP (THRC)

## 2016-08-27 ENCOUNTER — Emergency Department (HOSPITAL_COMMUNITY)
Admission: EM | Admit: 2016-08-27 | Discharge: 2016-08-27 | Disposition: A | Discharge status: Home / Self Care | Payer: Self-pay | Attending: Emergency Medicine | Admitting: Emergency Medicine

## 2016-08-27 DIAGNOSIS — F909 Attention-deficit hyperactivity disorder, unspecified type: Secondary | ICD-10-CM | POA: Insufficient documentation

## 2016-08-27 DIAGNOSIS — Z79899 Other long term (current) drug therapy: Secondary | ICD-10-CM | POA: Insufficient documentation

## 2016-08-27 DIAGNOSIS — F17200 Nicotine dependence, unspecified, uncomplicated: Secondary | ICD-10-CM | POA: Insufficient documentation

## 2016-08-27 DIAGNOSIS — G40909 Epilepsy, unspecified, not intractable, without status epilepticus: Secondary | ICD-10-CM | POA: Insufficient documentation

## 2016-08-27 LAB — COMPREHENSIVE METABOLIC PANEL
ALK PHOS: 65 U/L (ref 38–126)
ALT: 19 U/L (ref 17–63)
AST: 37 U/L (ref 15–41)
Albumin: 3.8 g/dL (ref 3.5–5.0)
Anion gap: 10 (ref 5–15)
BUN: 14 mg/dL (ref 6–20)
CALCIUM: 9.1 mg/dL (ref 8.9–10.3)
CO2: 23 mmol/L (ref 22–32)
CREATININE: 0.95 mg/dL (ref 0.61–1.24)
Chloride: 105 mmol/L (ref 101–111)
GFR calc non Af Amer: >60 mL/min (ref >60)
Glucose, Bld: 89 mg/dL (ref 65–99)
Potassium: 4 mmol/L (ref 3.5–5.1)
Sodium: 138 mmol/L (ref 135–145)
Total Bilirubin: 0.5 mg/dL (ref 0.3–1.2)
Total Protein: 6.5 g/dL (ref 6.5–8.1)

## 2016-08-27 LAB — VALPROIC ACID LEVEL: Valproic Acid Lvl: <10 ug/mL — ABNORMAL LOW (ref 50.0–100.0)

## 2016-08-27 LAB — CBC WITH DIFFERENTIAL/PLATELET
Basophils Absolute: 0 10*3/uL (ref 0.0–0.1)
Basophils Relative: 1 %
EOS PCT: 1 %
Eosinophils Absolute: 0.1 10*3/uL (ref 0.0–0.7)
HCT: 41.7 % (ref 39.0–52.0)
HEMOGLOBIN: 14.4 g/dL (ref 13.0–17.0)
LYMPHS ABS: 1 10*3/uL (ref 0.7–4.0)
LYMPHS PCT: 18 %
MCH: 32.1 pg (ref 26.0–34.0)
MCHC: 34.5 g/dL (ref 30.0–36.0)
MCV: 92.9 fL (ref 78.0–100.0)
MONOS PCT: 6 %
Monocytes Absolute: 0.3 10*3/uL (ref 0.1–1.0)
Neutro Abs: 4.1 10*3/uL (ref 1.7–7.7)
Neutrophils Relative %: 74 %
PLATELETS: 228 10*3/uL (ref 150–400)
RBC: 4.49 MIL/uL (ref 4.22–5.81)
RDW: 13.4 % (ref 11.5–15.5)
WBC: 5.6 10*3/uL (ref 4.0–10.5)

## 2016-08-27 LAB — CBG MONITORING, ED: Glucose-Capillary: 87 mg/dL (ref 65–99)

## 2016-08-27 MED ORDER — DIVALPROEX SODIUM ER 500 MG PO TB24: 1000.0000 mg | ORAL_TABLET | Freq: Two times a day (BID) | ORAL | 1 refills | Status: DC

## 2016-08-27 MED ORDER — LEVETIRACETAM 500 MG PO TABS
500.0000 mg | ORAL_TABLET | Freq: Once | ORAL | Status: AC
  Administered 2016-08-27: 500 mg via ORAL
  Filled 2016-08-27: qty 1

## 2016-08-27 NOTE — ED Triage Notes (Signed)
Pt BIB [facility] after he had a seizure in a truck and tried to step out and fell. Post-ictal upon arrival. AxOx4 now. Has not taken depakote in 1 month.

## 2016-08-27 NOTE — Discharge Instructions (Signed)
Please start taking the medication prescribed immediately. Refrain from any alcohol or drug use.  [place] law prevents people with seizures or fainting from driving or operating dangerous machinery until they are free of seizures or fainting for 6 months.

## 2016-08-27 NOTE — ED Notes (Signed)
Patient was alert, oriented and stable upon discharge. RN went over AVS and patient had no further questions.  

## 2016-08-27 NOTE — ED Provider Notes (Signed)
[facility]-EMERGENCY DEPT Provider Note   CSN: [id number] Arrival date & time: 08/27/16  4098     History   Chief Complaint Chief Complaint  Patient presents with  . Seizures    HPI Edward Brewer is a 26 y.o. male.  HPI PT with hx of seizures comes in after having a seizure. PT was at work, sitting in his truck when he had the episode. Seizures are well controlled, last episode yday. Pt denies nausea, emesis, fevers, chills, chest pains, shortness of breath, headaches, abdominal pain, uti like symptoms. Pt did use marijuana use today, denies any other drug use. Pt has no severe headaches right now and is back to normal mentation.  Past Medical History:  Diagnosis Date  . ADD (attention deficit disorder)   . Epilepsy (HCC)   . 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 Polysubstance abuse    Heroin, marijuana abuse    Patient Active Problem List   Diagnosis Date Noted  . Encounter for therapeutic drug monitoring 04/17/2015  . 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     Home Medications    Prior to Admission medications   Medication Sig Start Date End Date Taking? Authorizing Provider  ibuprofen (ADVIL,MOTRIN) 200 MG tablet Take 1,000 mg by mouth every 6 (six) hours as needed for headache or moderate pain.   Yes [provider]  amoxicillin (AMOXIL) 500 MG capsule Take 1 capsule (500 mg total) by mouth 3 (three) times daily. Patient not taking: Reported on 04/16/2016 10/08/15   Jaynie Crumble, PA-C  divalproex (DEPAKOTE ER) 500 MG 24 hr tablet Take 2 tablets (1,000 mg total) by mouth 2 (two) times daily. 08/27/16   Derwood Kaplan, MD  HYDROcodone-acetaminophen (NORCO) 5-325 MG tablet Take 1-2 tablets by mouth every 4 (four) hours as needed for moderate pain. Patient not taking: Reported on 04/16/2016 10/08/15   Jaynie Crumble, PA-C  ibuprofen (ADVIL,MOTRIN) 600 MG tablet  Take 1 tablet (600 mg total) by mouth every 6 (six) hours as needed. Patient not taking: Reported on 04/16/2016 10/08/15   Jaynie Crumble, PA-C  magic mouthwash SOLN Take 5 mLs by mouth 3 (three) times daily as needed for mouth pain. Patient not taking: Reported on 08/27/2016 07/04/16   Gregary Cromer    Family History Family History  Problem Relation Age of Onset  . Diabetes Father   . Seizures Neg Hx     Social History Social History  Substance Use Topics  . Smoking status: Current Every Day Smoker    Packs/day: 1.00  . Smokeless tobacco: Never Used  . Alcohol use No     Comment: occasional 6 beers a week     Allergies   Patient has no known allergies.   Review of Systems Review of Systems  Constitutional: Positive for activity change.  Respiratory: Negative for shortness of breath.   Cardiovascular: Negative for chest pain.  Neurological: Positive for seizures. Negative for headaches.  All other systems reviewed and are negative.    Physical Exam Updated Vital Signs BP (!) 114/94   Pulse (!) 47   Temp 97.9 F (36.6 C) (Oral)   Resp 17   SpO2 99%   Physical Exam  Constitutional: He is oriented to person, place, and time. He appears well-developed.  HENT:  Head: Atraumatic.  Neck: Neck supple.  Cardiovascular: Normal rate.   Pulmonary/Chest: Effort normal.  Neurological: He is alert and oriented to  person, place, and time.  Skin: Skin is warm.  Nursing note and vitals reviewed.    ED Treatments / Results  Labs (all labs ordered are listed, but only abnormal results are displayed) Labs Reviewed  VALPROIC ACID LEVEL - Abnormal; Notable for the following:       Result Value   Valproic Acid Lvl <10 (*)    All other components within normal limits  CBC WITH DIFFERENTIAL/PLATELET  COMPREHENSIVE METABOLIC PANEL  RAPID URINE DRUG SCREEN, HOSP PERFORMED  CBG MONITORING, ED    EKG  EKG Interpretation None       Radiology No results  found.  Procedures Procedures (including critical care time)  Medications Ordered in ED Medications  levETIRAcetam (KEPPRA) tablet 500 mg (500 mg Oral Given 08/27/16 1056)     Initial Impression / Assessment and Plan / ED Course  I have reviewed the triage vital signs and the nursing notes.  Pertinent labs & imaging results that were available during my care of the patient were reviewed by me and considered in my medical decision making (see chart for details).  Clinical Course as of Aug 28 1151  Wed Aug 27, 2016  1150 Results from the ER workup discussed with the patient face to face and all questions answered to the best of my ability.  Mother to take patient home. We gave rx for 3 months on depakote with coupons.  Strict ER return precautions have been discussed, and patient is agreeing with the plan and is comfortable with the workup done and the recommendations from the ER.   [AN]    Clinical Course User Index [AN] Derwood KaplanNanavati, Lisaanne Lawrie, MD    Pt comes in with cc of seizures.  DDx: -Seizure disorder -Meningitis -Trauma -ICH -Electrolyte abnormality -Metabolic derangement -Stroke -Toxin induced seizures -Medication side effects or non compliance -Hypoxia -Hypoglycemia  Pt comes in after seizure. Witnessed by coworker and there is tongue biting. Family at bedside. Hx suggestive of non compliance and possible marijuana use. Labs ordered. We will help with rx as pt lost insurance and pcp.    Final Clinical Impressions(s) / ED Diagnoses   Final diagnoses:  Seizure disorder [facility](HCC)    New Prescriptions Current Discharge Medication List       Derwood KaplanNanavati, Nathaneil Feagans, MD 08/27/16 1153

## 2016-09-17 ENCOUNTER — Encounter (HOSPITAL_COMMUNITY): Payer: Self-pay | Admitting: Emergency Medicine

## 2016-09-17 ENCOUNTER — Emergency Department (HOSPITAL_COMMUNITY)
Admission: EM | Admit: 2016-09-17 | Discharge: 2016-09-18 | Disposition: A | Discharge status: Home / Self Care | Payer: Self-pay | Attending: Emergency Medicine | Admitting: Emergency Medicine

## 2016-09-17 DIAGNOSIS — K047 Periapical abscess without sinus: Secondary | ICD-10-CM | POA: Insufficient documentation

## 2016-09-17 DIAGNOSIS — Z79899 Other long term (current) drug therapy: Secondary | ICD-10-CM | POA: Insufficient documentation

## 2016-09-17 DIAGNOSIS — F17200 Nicotine dependence, unspecified, uncomplicated: Secondary | ICD-10-CM | POA: Insufficient documentation

## 2016-09-17 DIAGNOSIS — F909 Attention-deficit hyperactivity disorder, unspecified type: Secondary | ICD-10-CM | POA: Insufficient documentation

## 2016-09-17 NOTE — ED Triage Notes (Signed)
Patient is complaining of abscess in mouth. Patient also complaining of dental pain. Patient states the abscess started two days ago.

## 2016-09-18 MED ORDER — PENICILLIN V POTASSIUM 500 MG PO TABS
500.0000 mg | ORAL_TABLET | Freq: Once | ORAL | Status: AC
  Administered 2016-09-18: 500 mg via ORAL
  Filled 2016-09-18: qty 1

## 2016-09-18 MED ORDER — PENICILLIN V POTASSIUM 500 MG PO TABS: 500.0000 mg | ORAL_TABLET | Freq: Four times a day (QID) | ORAL | 0 refills | Status: AC

## 2016-09-18 MED ORDER — IBUPROFEN 600 MG PO TABS: 600.0000 mg | ORAL_TABLET | Freq: Four times a day (QID) | ORAL | 0 refills | Status: DC | PRN

## 2016-09-18 NOTE — Discharge Instructions (Signed)
Take Penicillin as prescribed until finished. Follow up with a dentist ASAP. You may take ibuprofen or tylenol for pain control.

## 2016-09-18 NOTE — ED Provider Notes (Signed)
[facility]-EMERGENCY DEPT Provider Note   CSN: [id number] Arrival date & time: 09/17/16  2200     History   Chief Complaint Chief Complaint  Patient presents with  . Dental Problem    HPI Edward Brewer is a 26 y.o. male.  26 year old male with history of polysubstance abuse, epilepsy, and ADD presents to the emergency department for evaluation of dental pain. He states that symptoms have been going on for 1.5 days. He notes worsening swelling to the left lower aspect of his face. Pain is worse with eating. He has been taking ibuprofen with no relief. No associated fevers or purulence draining in the mouth. He has not recently been seen by a dentist.      Past Medical History:  Diagnosis Date  . ADD (attention deficit disorder)   . Epilepsy (HCC)   . 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 Polysubstance abuse    Heroin, marijuana abuse    Patient Active Problem List   Diagnosis Date Noted  . Encounter for therapeutic drug monitoring 04/17/2015  . 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 No pertinent surgical history.     Home Medications    Prior to Admission medications   Medication Sig Start Date End Date Taking? Authorizing Provider  divalproex (DEPAKOTE ER) 500 MG 24 hr tablet Take 2 tablets (1,000 mg total) by mouth 2 (two) times daily. 08/27/16   Derwood KaplanNanavati, Ankit, MD  ibuprofen (ADVIL,MOTRIN) 600 MG tablet Take 1 tablet (600 mg total) by mouth every 6 (six) hours as needed. 09/18/16   Antony MaduraHumes, Kelcy Laible, PA-C  magic mouthwash SOLN Take 5 mLs by mouth 3 (three) times daily as needed for mouth pain. Patient not taking: Reported on 08/27/2016 07/04/16   Mathews RobinsonsMitchell, Jessica B, PA-C  penicillin v potassium (VEETID) 500 MG tablet Take 1 tablet (500 mg total) by mouth 4 (four) times daily. 09/18/16 09/25/16  Antony MaduraHumes, Pranay Hilbun, PA-C    Family History Family History  Problem Relation Age of Onset  . Diabetes Father     . Seizures Neg Hx     Social History Social History  Substance Use Topics  . Smoking status: Current Every Day Smoker    Packs/day: 1.00  . Smokeless tobacco: Never Used  . Alcohol use No     Comment: occasional 6 beers a week     Allergies   Patient has no known allergies.   Review of Systems Review of Systems Ten systems reviewed and are negative for acute change, except as noted in the HPI.    Physical Exam Updated Vital Signs BP 120/89 (BP Location: Left Arm)   Pulse 63   Temp 97.8 F (36.6 C) (Oral)   Resp 20   Ht 5\' 8"  (1.727 m)   Wt 72.6 kg (160 lb)   SpO2 100%   BMI 24.33 kg/m   Physical Exam  Constitutional: He is oriented to person, place, and time. He appears well-developed and well-nourished. No distress.  Asleep on initial presentation  HENT:  Head: Normocephalic and atraumatic.  Mouth/Throat: Oropharynx is clear and moist.    No trismus. Mild left lower facial swelling. Patient tolerating secretions without difficulty.  Eyes: Conjunctivae and EOM are normal. No scleral icterus.  Neck: Normal range of motion.  Cardiovascular: Normal rate, regular rhythm and intact distal pulses.   Pulmonary/Chest: Effort normal. No respiratory distress.  Respirations even and unlabored  Musculoskeletal: Normal range of motion.  Neurological: He is alert and  oriented to person, place, and time. He exhibits normal muscle tone. Coordination normal.  Skin: Skin is warm and dry. No rash noted. He is not diaphoretic. No erythema. No pallor.  Psychiatric: He has a normal mood and affect. His behavior is normal.  Nursing note and vitals reviewed.    ED Treatments / Results  Labs (all labs ordered are listed, but only abnormal results are displayed) Labs Reviewed - No data to display  EKG  EKG Interpretation None       Radiology No results found.  Procedures Procedures (including critical care time)  Medications Ordered in ED Medications  penicillin  v potassium (VEETID) tablet 500 mg (not administered)     Initial Impression / Assessment and Plan / ED Course  I have reviewed the triage vital signs and the nursing notes.  Pertinent labs & imaging results that were available during my care of the patient were reviewed by me and considered in my medical decision making (see chart for details).     Patient with toothache. Symptoms and exam c/w likely early dental abscess. Patient afebrile. VSS. No trismus. Exam unconcerning for Ludwig's angina or spread of infection. Will treat with penicillin and NSAIDs. Urged patient to follow-up with dentist. Referral and resource guide provided as well as return precautions. Patient discharged in stable condition with no unaddressed concerns.   Final Clinical Impressions(s) / ED Diagnoses   Final diagnoses:  Dental abscess    New Prescriptions New Prescriptions   PENICILLIN V POTASSIUM (VEETID) 500 MG TABLET    Take 1 tablet (500 mg total) by mouth 4 (four) times daily.     Antony Madura, PA-C 09/18/16 Nydia Bouton    Geoffery Lyons, MD 09/22/16 [phone]
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
## 2017-04-21 ENCOUNTER — Ambulatory Visit: Payer: Self-pay | Admitting: Nurse Practitioner

## 2017-04-21 ENCOUNTER — Telehealth: Payer: Self-pay | Admitting: *Deleted

## 2017-04-21 NOTE — Telephone Encounter (Signed)
Patient was no show for follow up with NP today.  

## 2017-04-22 ENCOUNTER — Encounter: Payer: Self-pay | Admitting: Nurse Practitioner

## 2017-05-25 ENCOUNTER — Encounter: Payer: Self-pay | Admitting: Nurse Practitioner

## 2017-05-25 ENCOUNTER — Ambulatory Visit (INDEPENDENT_AMBULATORY_CARE_PROVIDER_SITE_OTHER): Payer: Self-pay | Admitting: Nurse Practitioner

## 2017-05-25 VITALS — BP 101/64 | HR 72 | Ht 68.0 in | Wt 169.0 lb

## 2017-05-25 DIAGNOSIS — G40309 Generalized idiopathic epilepsy and epileptic syndromes, not intractable, without status epilepticus: Secondary | ICD-10-CM

## 2017-05-25 MED ORDER — DIVALPROEX SODIUM ER 500 MG PO TB24: 1000.0000 mg | ORAL_TABLET | Freq: Two times a day (BID) | ORAL | 1 refills | Status: DC
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
## 2017-05-25 NOTE — Patient Instructions (Signed)
Continue Depakote at current dose will refill  Reviewed labs from ER visit Call for any seizure activity Follow-up in 6 months

## 2017-05-25 NOTE — Progress Notes (Signed)
Fax confirmation received Depakote to [facility]  [phone]. sy

## 2017-05-25 NOTE — Progress Notes (Signed)
I have read the note, and I agree with the clinical assessment and plan.  Kariann Wecker K Ewing Fandino   

## 2017-05-26 DIAGNOSIS — Z0289 Encounter for other administrative examinations: Secondary | ICD-10-CM

## 2017-11-08 ENCOUNTER — Other Ambulatory Visit: Payer: Self-pay

## 2017-11-08 ENCOUNTER — Emergency Department (HOSPITAL_COMMUNITY)
Admission: EM | Admit: 2017-11-08 | Discharge: 2017-11-08 | Disposition: A | Discharge status: Home / Self Care | Payer: Self-pay | Attending: Emergency Medicine | Admitting: Emergency Medicine

## 2017-11-08 DIAGNOSIS — F17210 Nicotine dependence, cigarettes, uncomplicated: Secondary | ICD-10-CM | POA: Insufficient documentation

## 2017-11-08 DIAGNOSIS — R112 Nausea with vomiting, unspecified: Secondary | ICD-10-CM | POA: Insufficient documentation

## 2017-11-08 DIAGNOSIS — F1910 Other psychoactive substance abuse, uncomplicated: Secondary | ICD-10-CM | POA: Insufficient documentation

## 2017-11-08 DIAGNOSIS — Z79899 Other long term (current) drug therapy: Secondary | ICD-10-CM | POA: Insufficient documentation

## 2017-11-08 LAB — CBC WITH DIFFERENTIAL/PLATELET
Abs Immature Granulocytes: 0 10*3/uL (ref 0.0–0.1)
Basophils Absolute: 0 10*3/uL (ref 0.0–0.1)
Basophils Relative: 0 %
EOS ABS: 0 10*3/uL (ref 0.0–0.7)
EOS PCT: 0 %
HEMATOCRIT: 46.3 % (ref 39.0–52.0)
Hemoglobin: 15.9 g/dL (ref 13.0–17.0)
Immature Granulocytes: 0 %
LYMPHS ABS: 2.7 10*3/uL (ref 0.7–4.0)
Lymphocytes Relative: 33 %
MCH: 32.3 pg (ref 26.0–34.0)
MCHC: 34.3 g/dL (ref 30.0–36.0)
MCV: 93.9 fL (ref 78.0–100.0)
MONO ABS: 0.5 10*3/uL (ref 0.1–1.0)
MONOS PCT: 6 %
Neutro Abs: 4.9 10*3/uL (ref 1.7–7.7)
Neutrophils Relative %: 61 %
Platelets: 266 10*3/uL (ref 150–400)
RBC: 4.93 MIL/uL (ref 4.22–5.81)
RDW: 11.6 % (ref 11.5–15.5)
WBC: 8.1 10*3/uL (ref 4.0–10.5)

## 2017-11-08 LAB — RAPID URINE DRUG SCREEN, HOSP PERFORMED
Amphetamines: POSITIVE — AB
BARBITURATES: NOT DETECTED
Benzodiazepines: NOT DETECTED
Cocaine: POSITIVE — AB
Opiates: NOT DETECTED
Tetrahydrocannabinol: POSITIVE — AB

## 2017-11-08 LAB — COMPREHENSIVE METABOLIC PANEL
ALBUMIN: 3.9 g/dL (ref 3.5–5.0)
ALT: 20 U/L (ref 0–44)
ANION GAP: 16 — AB (ref 5–15)
AST: 34 U/L (ref 15–41)
Alkaline Phosphatase: 77 U/L (ref 38–126)
BUN: 11 mg/dL (ref 6–20)
CALCIUM: 10.1 mg/dL (ref 8.9–10.3)
CO2: 21 mmol/L — ABNORMAL LOW (ref 22–32)
CREATININE: 1.14 mg/dL (ref 0.61–1.24)
Chloride: 101 mmol/L (ref 98–111)
Glucose, Bld: 115 mg/dL — ABNORMAL HIGH (ref 70–99)
Potassium: 3.4 mmol/L — ABNORMAL LOW (ref 3.5–5.1)
SODIUM: 138 mmol/L (ref 135–145)
TOTAL PROTEIN: 7.4 g/dL (ref 6.5–8.1)
Total Bilirubin: 0.9 mg/dL (ref 0.3–1.2)

## 2017-11-08 LAB — URINALYSIS, ROUTINE W REFLEX MICROSCOPIC
BILIRUBIN URINE: NEGATIVE
GLUCOSE, UA: NEGATIVE mg/dL
Hgb urine dipstick: NEGATIVE
KETONES UR: 20 mg/dL — AB
Leukocytes, UA: NEGATIVE
Nitrite: NEGATIVE
PH: 7 (ref 5.0–8.0)
Protein, ur: NEGATIVE mg/dL
Specific Gravity, Urine: 1.021 (ref 1.005–1.030)

## 2017-11-08 LAB — SALICYLATE LEVEL

## 2017-11-08 LAB — LIPASE, BLOOD: LIPASE: 25 U/L (ref 11–51)

## 2017-11-08 LAB — ACETAMINOPHEN LEVEL

## 2017-11-08 LAB — ETHANOL: Alcohol, Ethyl (B): <10 mg/dL (ref <10)

## 2017-11-08 MED ORDER — ONDANSETRON 4 MG PO TBDP: 4.0000 mg | ORAL_TABLET | Freq: Three times a day (TID) | ORAL | 0 refills | Status: DC | PRN

## 2017-11-08 MED ORDER — SODIUM CHLORIDE 0.9 % IV BOLUS (SEPSIS)
1000.0000 mL | Freq: Once | INTRAVENOUS | Status: AC
  Administered 2017-11-08: 1000 mL via INTRAVENOUS

## 2017-11-08 NOTE — ED Triage Notes (Signed)
Patient arrived, diaphoretic and slurring speech. Patient's friend states that he vomited x 1. Patient endorses drinking "a lot" of alcohol and using LSD.

## 2017-11-08 NOTE — ED Notes (Signed)
Pt stable, ambulatory, aware of follow-up appointments he needs to arrange, and services available for rehab.

## 2017-11-08 NOTE — ED Notes (Addendum)
Gave pt water, [place] sand, and crackers.  Tolerating well

## 2017-11-08 NOTE — ED Provider Notes (Signed)
[facility] EMERGENCY DEPARTMENT Provider Note   CSN: [id number] Arrival date & time: 11/08/17  0609     History   Chief Complaint Chief Complaint  Patient presents with  . Drug / Alcohol Assessment    HPI Edward Brewer is a 27 y.o. male.  HPI   Edward Brewer is a 27 y.o. male, with a history of polysubstance abuse, presenting to the ED with nausea and vomiting.  Patient was brought in by a friend, who then left. Patient states, "I do not remember much of last night, which means it was a really good night. I probably drank a lot and I know I dropped some acid." Patient's only complaint is that he "feels dehydrated." He endorses drinking between 6-18 alcoholic beverages a day, but does not know when his last alcohol intake occurred. Denies chest pain, shortness of breath, abdominal pain, neuro deficits, or any other complaints.    Past Medical History:  Diagnosis Date  . ADD (attention deficit disorder)   . Epilepsy (HCC)   . 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 Polysubstance abuse (HCC)    Heroin, marijuana abuse    Patient Active Problem List   Diagnosis Date Noted  . Encounter for therapeutic drug monitoring 04/17/2015  . 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      Home Medications    Prior to Admission medications   Medication Sig Start Date End Date Taking? Authorizing Provider  divalproex (DEPAKOTE ER) 500 MG 24 hr tablet Take 2 tablets (1,000 mg total) by mouth 2 (two) times daily. 05/25/17  Yes Nilda Riggs, NP  ibuprofen (ADVIL,MOTRIN) 600 MG tablet Take 1 tablet (600 mg total) by mouth every 6 (six) hours as needed. Patient not taking: Reported on 05/25/2017 09/18/16   Antony Madura, PA-C  ondansetron (ZOFRAN ODT) 4 MG disintegrating tablet Take 1 tablet (4 mg total) by mouth every 8 (eight) hours as needed for nausea or vomiting.  11/08/17   Larson Limones, Hillard Danker, PA-C    Family History Family History  Problem Relation Age of Onset  . Diabetes Father   . Seizures Neg Hx     Social History Social History   Tobacco Use  . Smoking status: Current Every Day Smoker    Packs/day: 1.00  . Smokeless tobacco: Never Used  Substance Use Topics  . Alcohol use: No    Comment: occasional 6 beers a week  . Drug use: Yes    Types: Marijuana, Heroin    Comment: 5 months clean     Allergies   Patient has no known allergies.   Review of Systems Review of Systems  Constitutional: Negative for chills, diaphoresis and fever.  Respiratory: Negative for shortness of breath.   Cardiovascular: Negative for chest pain.  Gastrointestinal: Positive for nausea and vomiting. Negative for abdominal pain.  Musculoskeletal: Negative for back pain and neck pain.  Neurological: Negative for dizziness, weakness, light-headedness, numbness and headaches.  All other systems reviewed and are negative.    Physical Exam Updated Vital Signs BP (!) 90/47 (BP Location: Left Arm)   Pulse 82   Resp (!) 22   Wt 77.1 kg   SpO2 99%   BMI 25.85 kg/m   Physical Exam  Constitutional: He is oriented to person, place, and time. He appears well-developed and well-nourished. No distress.  HENT:  Head: Normocephalic and atraumatic.  Mouth/Throat: Oropharynx is clear and  moist.  Eyes: Pupils are equal, round, and reactive to light. Conjunctivae and EOM are normal.  Neck: Normal range of motion. Neck supple.  Cardiovascular: Normal rate, regular rhythm, normal heart sounds and intact distal pulses.  Pulmonary/Chest: Effort normal and breath sounds normal. No respiratory distress.  Abdominal: Soft. There is no tenderness. There is no guarding.  Musculoskeletal: He exhibits no edema.  Lymphadenopathy:    He has no cervical adenopathy.  Neurological: He is alert and oriented to person, place, and time.  Sensation grossly intact to light touch in the  extremities. Strength 5/5 in all extremities. No gait disturbance. Coordination intact. Cranial nerves III-XII grossly intact. No facial droop.   Skin: Skin is warm and dry. He is not diaphoretic.  Psychiatric: He has a normal mood and affect. His behavior is normal.  Nursing note and vitals reviewed.    ED Treatments / Results  Labs (all labs ordered are listed, but only abnormal results are displayed) Labs Reviewed  COMPREHENSIVE METABOLIC PANEL - Abnormal; Notable for the following components:      Result Value   Potassium 3.4 (*)    CO2 21 (*)    Glucose, Bld 115 (*)    Anion gap 16 (*)    All other components within normal limits  ACETAMINOPHEN LEVEL - Abnormal; Notable for the following components:   Acetaminophen (Tylenol), Serum <10 (*)    All other components within normal limits  URINALYSIS, ROUTINE W REFLEX MICROSCOPIC - Abnormal; Notable for the following components:   Ketones, ur 20 (*)    All other components within normal limits  RAPID URINE DRUG SCREEN, HOSP PERFORMED - Abnormal; Notable for the following components:   Cocaine POSITIVE (*)    Amphetamines POSITIVE (*)    Tetrahydrocannabinol POSITIVE (*)    All other components within normal limits  ETHANOL  SALICYLATE LEVEL  CBC WITH DIFFERENTIAL/PLATELET  LIPASE, BLOOD    EKG EKG Interpretation  Date/Time:  Sunday November 08 2017 06:30:37 EDT Ventricular Rate:  85 PR Interval:    QRS Duration: 125 QT Interval:  405 QTC Calculation: 482 R Axis:   94 Text Interpretation:  Sinus rhythm Nonspecific intraventricular conduction delay Abnrm T, consider ischemia, anterolateral lds No old tracing to compare Confirmed by Ward, Baxter Hire [phone]) on 11/08/2017 6:41:06 AM   EKG Interpretation  Date/Time:  Sunday November 08 2017 08:41:19 EDT Ventricular Rate:  94 PR Interval:    QRS Duration: 117 QT Interval:  379 QTC Calculation: 474 R Axis:   101 Text Interpretation:  Sinus rhythm Atrial premature  complex Nonspecific intraventricular conduction delay Abnrm T, consider ischemia, anterolateral lds No significant change since last tracing Confirmed by Melene Plan [phone]) on 11/08/2017 9:19:08 AM       Radiology No results found.  Procedures Procedures (including critical care time)  Medications Ordered in ED Medications  sodium chloride 0.9 % bolus 1,000 mL (0 mLs Intravenous Stopped 11/08/17 0744)  sodium chloride 0.9 % bolus 1,000 mL (0 mLs Intravenous Stopped 11/08/17 0929)     Initial Impression / Assessment and Plan / ED Course  I have reviewed the triage vital signs and the nursing notes.  Pertinent labs & imaging results that were available during my care of the patient were reviewed by me and considered in my medical decision making (see chart for details).  Clinical Course as of Nov 08 953  Sun Nov 08, 2017  0745 Patient states he feels much better. Denies additional complaints.    [SJ]  Clinical Course User Index [SJ] Antwain Caliendo C, PA-C    Patient presents following a single episode of vomiting. Patient is nontoxic appearing, afebrile, not tachycardic, not tachypneic, maintains excellent SPO2 on room air, and is in no apparent distress.  Initial blood pressure in the hypotensive range, however, this quickly recovered without recurrence.  Patient had no recurrence of vomiting during his ED course. He is not yet ready for initiating recovery steps for his drug/alcohol use.  He was given support resources regardless. There were some T wave abnormalities noted on the EKG, but patient does not seem to have associated symptoms.  Patient was instructed to follow-up with cardiology on this matter. The patient was given instructions for home care as well as return precautions. Patient voices understanding of these instructions, accepts the plan, and is comfortable with discharge.  Findings and plan of care discussed with Melene Plan, DO.   Vitals:   11/08/17 0700 11/08/17  0715 11/08/17 0730 11/08/17 0745  BP: (!) 130/97 128/90 122/78 124/86  Pulse: 92 95 99 100  Resp: 14 (!) 24 17 (!) 22  SpO2: 98% 99% 100% 98%  Weight:       Vitals:   11/08/17 0845 11/08/17 0900 11/08/17 0915 11/08/17 0945  BP: 122/87 114/81 124/84 (!) 126/94  Pulse: 98 94 88 92  Resp: (!) 22 (!) 21 16 10   SpO2: 97% 98% 98% 100%  Weight:         Final Clinical Impressions(s) / ED Diagnoses   Final diagnoses:  Non-intractable vomiting with nausea, unspecified vomiting type    ED Discharge Orders         Ordered    ondansetron (ZOFRAN ODT) 4 MG disintegrating tablet  Every 8 hours PRN     11/08/17 0942           Anselm Pancoast, PA-C 11/08/17 0955    Melene Plan, DO 11/08/17 1553

## 2017-11-08 NOTE — ED Notes (Signed)
Pt ambulated to restroom from room, tolerated well. 

## 2017-11-08 NOTE — ED Notes (Signed)
Pt unable to provide urine sample at this time. Pt aware urine sample is needed. Will try again later. 

## 2017-11-08 NOTE — Discharge Instructions (Addendum)
Please stay well-hydrated by drinking plenty of water.  Recommend against illicit drug and excessive alcohol use.  Resources have been provided should you change your mind regarding recovery.  May use Zofran, as needed, for nausea/vomiting.  There were some abnormalities noted on the EKG.  Recommend follow-up with the cardiology team.  Return to the ED for onset of any other concerning symptoms.

## 2017-11-09 NOTE — Progress Notes (Signed)
Cardiology Office Note   Date:  11/11/2017   ID:  Edward Brewer, DOB Jul 06, 1990, MRN [id number]  PCP:  Patient, No Pcp Per  Cardiologist:   Peter Swaziland, MD   Chief Complaint  Patient presents with  . Abnormal ECG      History of Present Illness: Edward Brewer is a 27 y.o. male who is seen at the request of Melene Plan DO in the ED for evaluation of abnormal Ecg. The patient was seen in the ED on 11/08/17 for intractable N/V related to Etoh, cocaine, amphetamines and THC. Given IV fluids and DC on Zofran. Ecg felt to be abnormal. No prior cardiac history. Denies any chest pain, SOB, edema, palpitations, dizziness. No prior cardiac history or murmur.  Past Medical History:  Diagnosis Date  . ADD (attention deficit disorder)   . Epilepsy (HCC)   . 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 Polysubstance abuse (HCC)    Heroin, marijuana abuse    History reviewed. No pertinent surgical history.   Current Outpatient Medications  Medication Sig Dispense Refill  . divalproex (DEPAKOTE ER) 500 MG 24 hr tablet Take 2 tablets (1,000 mg total) by mouth 2 (two) times daily. 360 tablet 1  . ondansetron (ZOFRAN ODT) 4 MG disintegrating tablet Take 1 tablet (4 mg total) by mouth every 8 (eight) hours as needed for nausea or vomiting. 20 tablet 0   No current facility-administered medications for this visit.     Allergies:   Patient has no known allergies.    Social History:  The patient  reports that he has been smoking. He has been smoking about 1.00 pack per day. He has never used smokeless tobacco. He reports that he has current or past drug history. Drugs: Marijuana and Heroin. He reports that he does not drink alcohol.   Family History:  The patient's family history includes Diabetes in his father.    ROS:  Please see the history of present illness.   Otherwise, review of systems are positive for none.   All other systems are reviewed  and negative.    PHYSICAL EXAM: VS:  BP 115/82 (BP Location: Right Arm)   Pulse 70   Ht 5\' 8"  (1.727 m)   Wt 168 lb 3.2 oz (76.3 kg)   BMI 25.57 kg/m  , BMI Body mass index is 25.57 kg/m. GEN: Well nourished, well developed, in no acute distress  HEENT: normal  Neck: no JVD, carotid bruits, or masses Cardiac: RRR; no murmurs, rubs, or gallops,no edema  Respiratory:  clear to auscultation bilaterally, normal work of breathing GI: soft, nontender, nondistended, + BS MS: no deformity or atrophy  Skin: warm and dry, no rash Neuro:  Strength and sensation are intact Psych: euthymic mood, full affect   EKG:  EKG is not ordered today. The ekg ordered 11/08/17  demonstrates NSR with borderline IVCD. One PAC. I have personally reviewed and interpreted this study.    Recent Labs: 11/08/2017: ALT 20; BUN 11; Creatinine, Ser 1.14; Hemoglobin 15.9; Platelets 266; Potassium 3.4; Sodium 138    Lipid Panel No results found for: CHOL, TRIG, HDL, CHOLHDL, VLDL, LDLCALC, LDLDIRECT    Wt Readings from Last 3 Encounters:  11/11/17 168 lb 3.2 oz (76.3 kg)  11/08/17 170 lb (77.1 kg)  05/25/17 169 lb (76.7 kg)      Other studies Reviewed: Additional studies/ records that were reviewed today include: none   ASSESSMENT AND PLAN:  1.  Abnormal Ecg. Really normal. PAC. Mild changes may be related to hypokalemia. No murmur. No further work up needed. Needs to focus on abstinence from drugs and Etoh. No follow up needed.  Signed, Peter Swaziland, MD  11/11/2017 3:20 PM    [facility] [address], [place], [place], [address] Phone [phone], Fax [phone]

## 2017-11-10 ENCOUNTER — Encounter: Payer: Self-pay | Admitting: Cardiology

## 2017-11-11 ENCOUNTER — Ambulatory Visit (INDEPENDENT_AMBULATORY_CARE_PROVIDER_SITE_OTHER): Payer: Self-pay | Admitting: Cardiology

## 2017-11-11 ENCOUNTER — Encounter: Payer: Self-pay | Admitting: Cardiology

## 2017-11-11 VITALS — BP 115/82 | HR 70 | Ht 68.0 in | Wt 168.2 lb

## 2017-11-11 DIAGNOSIS — I491 Atrial premature depolarization: Secondary | ICD-10-CM
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
## 2017-11-24 ENCOUNTER — Ambulatory Visit: Payer: Self-pay | Admitting: Nurse Practitioner

## 2017-11-24 ENCOUNTER — Encounter: Payer: Self-pay | Admitting: Nurse Practitioner

## 2017-12-26 ENCOUNTER — Other Ambulatory Visit: Payer: Self-pay | Admitting: Nurse Practitioner

## 2018-01-04 ENCOUNTER — Telehealth: Payer: Self-pay | Admitting: *Deleted

## 2018-01-04 MED ORDER — DIVALPROEX SODIUM ER 500 MG PO TB24: 1000.0000 mg | ORAL_TABLET | Freq: Two times a day (BID) | ORAL | 1 refills | Status: DC

## 2018-01-04 NOTE — Telephone Encounter (Signed)
Received refill request from [facility], [address] This RN scheduled patient for first available FU, Feb 2020 and put note to pharmacy, re: patient must keep 03/30/18 FU, 7:45 am.  Also put patient on wait list for sooner FU. Refilled Divalproex x 6 months.
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
## 2018-03-30 ENCOUNTER — Ambulatory Visit: Payer: Self-pay | Admitting: Nurse Practitioner

## 2018-03-30 ENCOUNTER — Telehealth: Payer: Self-pay | Admitting: *Deleted

## 2018-03-30 NOTE — Telephone Encounter (Signed)
Patient was no show for follow up with NP today.  

## 2018-03-31 ENCOUNTER — Encounter: Payer: Self-pay | Admitting: Nurse Practitioner

## 2018-06-04 ENCOUNTER — Encounter (HOSPITAL_COMMUNITY): Payer: Self-pay | Admitting: Emergency Medicine

## 2018-06-04 ENCOUNTER — Other Ambulatory Visit: Payer: Self-pay

## 2018-06-04 ENCOUNTER — Emergency Department (HOSPITAL_COMMUNITY)
Admission: EM | Admit: 2018-06-04 | Discharge: 2018-06-04 | Disposition: A | Discharge status: Home / Self Care | Payer: Self-pay | Attending: Emergency Medicine | Admitting: Emergency Medicine

## 2018-06-04 DIAGNOSIS — Z79899 Other long term (current) drug therapy: Secondary | ICD-10-CM | POA: Insufficient documentation

## 2018-06-04 DIAGNOSIS — R002 Palpitations: Secondary | ICD-10-CM

## 2018-06-04 DIAGNOSIS — F17210 Nicotine dependence, cigarettes, uncomplicated: Secondary | ICD-10-CM | POA: Insufficient documentation

## 2018-06-04 DIAGNOSIS — F1910 Other psychoactive substance abuse, uncomplicated: Secondary | ICD-10-CM

## 2018-06-04 LAB — CBC
HCT: 54.4 % — ABNORMAL HIGH (ref 39.0–52.0)
Hemoglobin: 18.3 g/dL — ABNORMAL HIGH (ref 13.0–17.0)
MCH: 31.8 pg (ref 26.0–34.0)
MCHC: 33.6 g/dL (ref 30.0–36.0)
MCV: 94.6 fL (ref 80.0–100.0)
Platelets: 300 10*3/uL (ref 150–400)
RBC: 5.75 MIL/uL (ref 4.22–5.81)
RDW: 11.8 % (ref 11.5–15.5)
WBC: 9.3 10*3/uL (ref 4.0–10.5)
nRBC: 0 % (ref 0.0–0.2)

## 2018-06-04 LAB — BASIC METABOLIC PANEL
Anion gap: 9 (ref 5–15)
BUN: 6 mg/dL (ref 6–20)
CO2: 26 mmol/L (ref 22–32)
Calcium: 9.8 mg/dL (ref 8.9–10.3)
Chloride: 104 mmol/L (ref 98–111)
Creatinine, Ser: 0.91 mg/dL (ref 0.61–1.24)
GFR calc Af Amer: >60 mL/min (ref >60)
GFR calc non Af Amer: >60 mL/min (ref >60)
Glucose, Bld: 89 mg/dL (ref 70–99)
Potassium: 4.3 mmol/L (ref 3.5–5.1)
Sodium: 139 mmol/L (ref 135–145)

## 2018-06-04 LAB — VALPROIC ACID LEVEL: Valproic Acid Lvl: <10 ug/mL — ABNORMAL LOW (ref 50.0–100.0)

## 2018-06-04 LAB — ETHANOL: Alcohol, Ethyl (B): 51 mg/dL — ABNORMAL HIGH (ref <10)

## 2018-06-04 LAB — SALICYLATE LEVEL: Salicylate Lvl: <7 mg/dL (ref 2.8–30.0)

## 2018-06-04 LAB — ACETAMINOPHEN LEVEL: Acetaminophen (Tylenol), Serum: <10 ug/mL — ABNORMAL LOW (ref 10–30)

## 2018-06-04 MED ORDER — LORAZEPAM 1 MG PO TABS
1.0000 mg | ORAL_TABLET | Freq: Once | ORAL | Status: AC
  Administered 2018-06-04: 1 mg via ORAL
  Filled 2018-06-04: qty 1

## 2018-06-04 MED ORDER — LORAZEPAM 2 MG/ML IJ SOLN
1.0000 mg | Freq: Once | INTRAMUSCULAR | Status: AC
  Administered 2018-06-04: 1 mg via INTRAVENOUS
  Filled 2018-06-04: qty 1

## 2018-06-04 MED ORDER — SODIUM CHLORIDE 0.9 % IV BOLUS
1000.0000 mL | Freq: Once | INTRAVENOUS | Status: AC
  Administered 2018-06-04: 1000 mL via INTRAVENOUS

## 2018-06-04 MED ORDER — SODIUM CHLORIDE 0.9 % IV BOLUS: 500.0000 mL | Freq: Once | INTRAVENOUS | Status: DC

## 2018-06-04 NOTE — ED Provider Notes (Signed)
Care assumed from [facility] at shift change, please see her note for full details, but in brief Edward Brewer is a 28 y.o. male who presented today with altered mental status after using LSD and drinking alcohol.  Patient came in reporting that he "w[place]".  Patient is calm and cooperative after receiving Ativan and his lab evaluation is reassuring.  Patient is receiving additional IV fluids for tachycardia.  Plan: Discharge after IV fluids, once tachycardia has improved.  Physical Exam  BP 134/87   Pulse (!) 120   Temp 98.1 F (36.7 C) (Oral)   Resp 20   Ht 5\' 8"  (1.727 m)   Wt 70.3 kg   SpO2 100%   BMI 23.57 kg/m   Physical Exam Vitals signs and nursing note reviewed.  Constitutional:      General: He is not in acute distress.    Appearance: He is well-developed. He is not diaphoretic.  HENT:     Head: Normocephalic and atraumatic.  Eyes:     General:        Right eye: No discharge.        Left eye: No discharge.  Cardiovascular:     Rate and Rhythm: Tachycardia present.  Pulmonary:     Effort: Pulmonary effort is normal. No respiratory distress.  Neurological:     Mental Status: He is alert and oriented to person, place, and time.     Coordination: Coordination normal.     Comments: Alert and oriented, able to follow commands, clear speech.  Moving all extremities with normal coordination.  Psychiatric:        Behavior: Behavior normal.     ED Course/Procedures   Labs Reviewed  ETHANOL - Abnormal; Notable for the following components:      Result Value   Alcohol, Ethyl (B) 51 (*)    All other components within normal limits  VALPROIC ACID LEVEL - Abnormal; Notable for the following components:   Valproic Acid Lvl <10 (*)    All other components within normal limits  CBC - Abnormal; Notable for the following components:   Hemoglobin 18.3 (*)    HCT 54.4 (*)    All other components within normal limits  ACETAMINOPHEN LEVEL -  Abnormal; Notable for the following components:   Acetaminophen (Tylenol), Serum <10 (*)    All other components within normal limits  BASIC METABOLIC PANEL  SALICYLATE LEVEL  RAPID URINE DRUG SCREEN, HOSP PERFORMED    EKG Interpretation  Date/Time:  Friday June 04 2018 05:34:58 EDT Ventricular Rate:  109 PR Interval:    QRS Duration: 111 QT Interval:  336 QTC Calculation: 453 R Axis:   105 Text Interpretation:  Sinus tachycardia Right atrial enlargement Borderline right axis deviation No significant change was found Confirmed by Molpus, John ([id number]) on 06/04/2018 5:45:07 AM       Procedures  MDM   Patient heart rate is improving, patient is now more awake, alert and oriented.  Patient's heart rate remains in the 110s, will give p.o. challenge, additional fluid bolus and 1 dose of p.o. Ativan and then ambulate patient and ensure he has returned fully to baseline.  On reevaluation patient's heart rate has improved and remains in the low 100s.  He is tolerating p.o., is alert and oriented x4 and has returned to his baseline.  He is requesting to be discharged and has a family member that is able to come pick him up.  I have given patient resources for substance abuse help and counseled him on appropriate return precautions.  At this time he is stable for discharge home.  Vitals:   06/04/18 0715 06/04/18 0730 06/04/18 0745 06/04/18 0934  BP:  (!) 137/92  119/84  Pulse: (!) 114 (!) 119 (!) 109 (!) 106  Resp: 17 17 18  (!) 22  Temp:      TempSrc:      SpO2: 99% 100% 98% 100%  Weight:      Height:           Dartha LodgeFord, Aava Deland N, PA-C 06/04/18 0941    Melene PlanFloyd, Dan, DO 06/04/18 1339

## 2018-06-04 NOTE — ED Notes (Signed)
Pt provided with water and a [place] sandwich

## 2018-06-04 NOTE — ED Provider Notes (Signed)
[facility]-EMERGENCY DEPT Provider Note   CSN: [id number] Arrival date & time: 06/04/18  [id number]    History   Chief Complaint Chief Complaint  Patient presents with  . Altered Mental Status    HPI Tamala FothergillBryan K Plunk is a 28 y.o. male.    28 y/o male with hx of polysubstance abuse, ADD, and a history of seizures presents to the emergency department for evaluation after mother found the patient lying on the floor, naked.  Patient endorses using LSD and drinking alcohol around midnight tonight.  Is unsure of the quantity of alcohol, but states that he has used LSD in the past and is experiencing palpitations.  Also complaining of mild shortness of breath.  States that he "wants out of this trip".  He has been largely noncompliant with his Depakote; takes it sporadically with longstanding history of noncompliance.  No witnessed seizure activity tonight or recently.   Altered Mental Status    Past Medical History:  Diagnosis Date  . ADD (attention deficit disorder)   . Epilepsy (HCC)   . 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 Polysubstance abuse (HCC)    Heroin, marijuana abuse    Patient Active Problem List   Diagnosis Date Noted  . Encounter for therapeutic drug monitoring 04/17/2015  . 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 No pertinent surgical history.      Home Medications    Prior to Admission medications   Medication Sig Start Date End Date Taking? Authorizing Provider  divalproex (DEPAKOTE ER) 500 MG 24 hr tablet Take 2 tablets (1,000 mg total) by mouth 2 (two) times daily. 01/04/18   York SpanielWillis, Charles K, MD  ondansetron (ZOFRAN ODT) 4 MG disintegrating tablet Take 1 tablet (4 mg total) by mouth every 8 (eight) hours as needed for nausea or vomiting. 11/08/17   Joy, Hillard DankerShawn C, PA-C    Family History Family History  Problem Relation Age of Onset  . Diabetes Father   .  Seizures Neg Hx     Social History Social History   Tobacco Use  . Smoking status: Current Every Day Smoker    Packs/day: 1.00  . Smokeless tobacco: Never Used  Substance Use Topics  . Alcohol use: No    Comment: occasional 6 beers a week  . Drug use: Yes    Types: Marijuana, Heroin    Comment: 5 months clean     Allergies   Patient has no known allergies.   Review of Systems Review of Systems Ten systems reviewed and are negative for acute change, except as noted in the HPI.    Physical Exam Updated Vital Signs BP 134/87   Pulse (!) 120   Temp 98.1 F (36.7 C) (Oral)   Resp 20   Ht 5\' 8"  (1.727 m)   Wt 70.3 kg   SpO2 100%   BMI 23.57 kg/m   Physical Exam Vitals signs and nursing note reviewed.  Constitutional:      General: He is not in acute distress.    Appearance: He is well-developed. He is not diaphoretic.     Comments: Anxious appearing. Disheveled.  HENT:     Head: Normocephalic and atraumatic.     Mouth/Throat:     Mouth: Mucous membranes are moist.     Comments: Posterior oropharynx without edema.  Uvula midline.  Patient tolerating secretions without difficulty.  Normal phonation.  Missing dentition. Eyes:     General: No  scleral icterus.    Conjunctiva/sclera: Conjunctivae normal.  Neck:     Musculoskeletal: Normal range of motion.     Comments: No stridor Cardiovascular:     Rate and Rhythm: Regular rhythm. Tachycardia present.     Pulses: Normal pulses.  Pulmonary:     Effort: Pulmonary effort is normal. No respiratory distress.     Breath sounds: No stridor. No wheezing, rhonchi or rales.     Comments: Lungs CTAB.  Not tachypneic as noted during triage. Musculoskeletal: Normal range of motion.  Skin:    General: Skin is warm and dry.     Coloration: Skin is not pale.     Findings: No erythema or rash.     Comments: Scabbed papules noted to bilateral forearms.  Neurological:     Mental Status: He is alert and oriented to person,  place, and time.  Psychiatric:        Mood and Affect: Mood is anxious.        Behavior: Behavior is cooperative.      ED Treatments / Results  Labs (all labs ordered are listed, but only abnormal results are displayed) Labs Reviewed  ETHANOL - Abnormal; Notable for the following components:      Result Value   Alcohol, Ethyl (B) 51 (*)    All other components within normal limits  VALPROIC ACID LEVEL - Abnormal; Notable for the following components:   Valproic Acid Lvl <10 (*)    All other components within normal limits  CBC - Abnormal; Notable for the following components:   Hemoglobin 18.3 (*)    HCT 54.4 (*)    All other components within normal limits  ACETAMINOPHEN LEVEL - Abnormal; Notable for the following components:   Acetaminophen (Tylenol), Serum <10 (*)    All other components within normal limits  BASIC METABOLIC PANEL  SALICYLATE LEVEL  RAPID URINE DRUG SCREEN, HOSP PERFORMED    EKG EKG Interpretation  Date/Time:  Friday June 04 2018 05:34:58 EDT Ventricular Rate:  109 PR Interval:    QRS Duration: 111 QT Interval:  336 QTC Calculation: 453 R Axis:   105 Text Interpretation:  Sinus tachycardia Right atrial enlargement Borderline right axis deviation No significant change was found Confirmed by Molpus, John ([id number]) on 06/04/2018 5:45:07 AM   Radiology No results found.  Procedures Procedures (including critical care time)  Medications Ordered in ED Medications  sodium chloride 0.9 % bolus 1,000 mL (has no administration in time range)  sodium chloride 0.9 % bolus 1,000 mL (0 mLs Intravenous Stopped 06/04/18 0631)  LORazepam (ATIVAN) injection 1 mg (1 mg Intravenous Given 06/04/18 0547)     Initial Impression / Assessment and Plan / ED Course  I have reviewed the triage vital signs and the nursing notes.  Pertinent labs & imaging results that were available during my care of the patient were reviewed by me and considered in my medical decision  making (see chart for details).        28 year old male presents to the emergency department for altered mental status after being found on the floor naked by his mother.  Endorses use of LSD and alcohol tonight.  History of seizure activity with medication noncompliance, though no witnessed seizure activity tonight and no evidence of trauma, tongue biting, incontinence.  Patient noted to be tachycardic on arrival.  Will hydrate with IV fluids.  Ativan given for anxiety management.  Hypertension has improved.  Plan to continue hydration with anticipated discharge once heart  rate has improved.  Care signed out to Jodi Geralds, PA-C at shift change who will assume care and disposition appropriately.   Final Clinical Impressions(s) / ED Diagnoses   Final diagnoses:  Polysubstance abuse [facility])  Palpitations    ED Discharge Orders    None       Antony Madura, [place] 06/04/18 3729

## 2018-06-04 NOTE — ED Triage Notes (Signed)
Patient BIB [facility] from home. Pt mother called EMS thinking pt had possible overdose. Found son lying on floor naked. Pt stated to EMS "do something for me or I'm going to blow my brains out". Pt endorses using LSD and ETOH, but doesn't remember what time or amount of alcohol. Pt then stated he wasn't answering any more questions, just do something for me. Pt stated he wants "out of this trip" Pt has hx of seizures but has not been taking medication x 1 yr. No known injury at this time.

## 2018-06-04 NOTE — Discharge Instructions (Signed)
Your symptoms today were likely related to your alcohol and LSD use.  Work-up is otherwise been reassuring.  You can follow-up using the resources provided for help with substance abuse.

## 2018-06-15 ENCOUNTER — Telehealth: Payer: Self-pay

## 2018-06-15 NOTE — Telephone Encounter (Signed)
Pt called in to schedule his 1 year f/u with Dr. Anne Hahn. Pt's last appt was with CM, NP on 05/25/17. He states he has been without his Depakote for 30 days and is in need of a refill.   I advised, due to current COVID 19 pandemic, our office is severely reducing in office visits until further notice, in order to minimize the risk to our patients and healthcare providers.   Pt was offered a video visit with Dr. Anne Hahn and accepted.   Pt understands that although there may be some limitations with this type of visit, we will take all precautions to reduce any security or privacy concerns.  Pt understands that this will be treated like an in office visit and we will file with pt's insurance, and there may be a patient responsible charge related to this service.  Pt's allergies, meds and pmh have been updated. Pt agreed to receive link for visit via text to # [phone].  [employer] link has been sent.

## 2018-06-16 ENCOUNTER — Encounter: Payer: Self-pay | Admitting: Neurology

## 2018-06-16 ENCOUNTER — Other Ambulatory Visit: Payer: Self-pay

## 2018-06-16 ENCOUNTER — Ambulatory Visit (INDEPENDENT_AMBULATORY_CARE_PROVIDER_SITE_OTHER): Payer: Self-pay | Admitting: Neurology

## 2018-06-16 DIAGNOSIS — G40309 Generalized idiopathic epilepsy and epileptic syndromes, not intractable, without status epilepticus: Secondary | ICD-10-CM

## 2018-06-16 MED ORDER — DIVALPROEX SODIUM ER 500 MG PO TB24: 1000.0000 mg | ORAL_TABLET | Freq: Two times a day (BID) | ORAL | 1 refills | Status: DC

## 2018-06-16 NOTE — Progress Notes (Signed)
     Virtual Visit via Video Note  I connected with Edward Brewer on 06/16/18 at  1:30 PM EDT by a video enabled telemedicine application and verified that I am speaking with the correct person using two identifiers.  Location: Patient: The patient is at home. Provider: The physician is in office.   I discussed the limitations of evaluation and management by telemedicine and the availability of in person appointments. The patient expressed understanding and agreed to proceed.  History of Present Illness: Edward Brewer is a 28 year old right-handed white male with a history of seizures and a history of polysubstance abuse.  The patient recent was in the emergency room on 04 June 2018 after he was found with altered mental status, he was found naked in his room on the floor.  He apparently had been drinking alcohol and had done LSD.  The patient indicates that he continues to drink, he has "a couple shots of whiskey" each night.  The patient continues to smoke marijuana but he claims he does not do any further LSD or cocaine.  He has not been taking his Depakote in greater than a month, he claims that he cannot afford it.  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  The last seizure was in July 2018.  The patient did not show for revisits on 3 occasions within the last year but for some reason was never discharged from our practice.   Observations/Objective: On the video evaluation, the patient is alert and cooperative.  His speech is well enunciated, not aphasic or dysarthric.  Extraocular movements are full.  Facial symmetry is present.  The patient is able to protrude the tongue in the midline with good lateral movements of the tongue.  He has good finger-nose-finger and heel-to-shin bilaterally.  Gait is normal, tandem gait is normal.  Romberg is negative.  Assessment and Plan: 1.  History of seizures  2.  Medical noncompliance  3.   Polysubstance abuse, alcohol, LSD, marijuana  The patient has not been compliant with taking his medications.  I will make sure that a prescription will be sent into the pharmacy.  The patient will follow-up here in 6 months, we will check blood work at that time.  The recent Depakote level in the emergency room on 04 June 2018 was essentially 0.  Follow Up Instructions: 85-month follow-up, may see nurse practitioner.   I discussed the assessment and treatment plan with the patient. The patient was provided an opportunity to ask questions and all were answered. The patient agreed with the plan and demonstrated an understanding of the instructions.   The patient was advised to call back or seek an in-person evaluation if the symptoms worsen or if the condition fails to improve as anticipated.  I provided 15 minutes of non-face-to-face time during this encounter.   York Spaniel, MD

## 2018-12-19 NOTE — Progress Notes (Deleted)
PATIENT: Edward Brewer DOB: [birth year]  REASON FOR VISIT: follow up HISTORY FROM: patient  HISTORY OF PRESENT ILLNESS: Today 12/19/18  Edward Brewer is a 28 year old male with history of seizures and a history of polysubstance abuse.  His last seizure was in July 2018.  He was in the emergency room in April 2020 after he was found with altered mental status, he was found naked in his room on the floor.  He had been drinking alcohol and had done LSD.  At the time, he had not been taking his Depakote for seizures.  HISTORY 06/16/2018 Dr. Anne Hahn: Edward Brewer is a 28 year old right-handed white male with a history of seizures and a history of polysubstance abuse.  The patient recent was in the emergency room on 04 June 2018 after he was found with altered mental status, he was found naked in his room on the floor.  He apparently had been drinking alcohol and had done LSD.  The patient indicates that he continues to drink, he has "a couple shots of whiskey" each night.  The patient continues to smoke marijuana but he claims he does not do any further LSD or cocaine.  He has not been taking his Depakote in greater than a month, he claims that he cannot afford it.  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  The last seizure was in July 2018.  The patient did not show for revisits on 3 occasions within the last year but for some reason was never discharged from our practice.  REVIEW OF SYSTEMS: Out of a complete 14 system review of symptoms, the patient complains only of the following symptoms, and all other reviewed systems are negative.  ALLERGIES: No Known Allergies  HOME MEDICATIONS: Outpatient Medications Prior to Visit  Medication Sig Dispense Refill  . divalproex (DEPAKOTE ER) 500 MG 24 hr tablet Take 2 tablets (1,000 mg total) by mouth 2 (two) times daily. 360 tablet 1  . ondansetron (ZOFRAN ODT) 4 MG disintegrating tablet Take 1  tablet (4 mg total) by mouth every 8 (eight) hours as needed for nausea or vomiting. (Patient not taking: Reported on 06/04/2018) 20 tablet 0   No facility-administered medications prior to visit.     PAST MEDICAL HISTORY: Past Medical History:  Diagnosis Date  . ADD (attention deficit disorder)   . Epilepsy (HCC)   . 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 Polysubstance abuse (HCC)    Heroin, marijuana abuse    PAST SURGICAL HISTORY: No past surgical history on file.  FAMILY HISTORY: Family History  Problem Relation Age of Onset  . Diabetes Father   . Seizures Neg Hx     SOCIAL HISTORY: Social History   Socioeconomic History  . Marital status: Single    Spouse name: Not on file  . Number of children: 0  . Years of education: hs  . Highest education level: Not on file  Occupational History  . Occupation: unemployed  Social Needs  . Financial resource strain: Not on file  . Food insecurity    Worry: Not on file    Inability: Not on file  . Transportation needs    Medical: Not on file    Non-medical: Not on file  Tobacco Use  . Smoking status: Current Every Day Smoker    Packs/day: 1.00  . Smokeless tobacco: Never Used  Substance and Sexual Activity  .  Alcohol use: No    Comment: occasional 6 beers a week  . Drug use: Yes    Types: Marijuana, Heroin    Comment: 5 months clean  . Sexual activity: Not on file  Lifestyle  . Physical activity    Days per week: Not on file    Minutes per session: Not on file  . Stress: Not on file  Relationships  . Social Herbalist on phone: Not on file    Gets together: Not on file    Attends religious service: Not on file    Active member of club or organization: Not on file    Attends meetings of clubs or organizations: Not on file    Relationship status: Not on file  . Intimate partner violence    Fear of current or ex partner: Not on file    Emotionally abused:  Not on file    Physically abused: Not on file    Forced sexual activity: Not on file  Other Topics Concern  . Not on file  Social History Narrative  . Not on file      PHYSICAL EXAM  There were no vitals filed for this visit. There is no height or weight on file to calculate BMI.  Generalized: Well developed, in no acute distress   Neurological examination  Mentation: Alert oriented to time, place, history taking. Follows all commands speech and language fluent Cranial nerve II-XII: Pupils were equal round reactive to light. Extraocular movements were full, visual field were full on confrontational test. Facial sensation and strength were normal. Uvula tongue midline. Head turning and shoulder shrug  were normal and symmetric. Motor: The motor testing reveals 5 over 5 strength of all 4 extremities. Good symmetric motor tone is noted throughout.  Sensory: Sensory testing is intact to soft touch on all 4 extremities. No evidence of extinction is noted.  Coordination: Cerebellar testing reveals good finger-nose-finger and heel-to-shin bilaterally.  Gait and station: Gait is normal. Tandem gait is normal. Romberg is negative. No drift is seen.  Reflexes: Deep tendon reflexes are symmetric and normal bilaterally.   DIAGNOSTIC DATA (LABS, IMAGING, TESTING) - I reviewed patient records, labs, notes, testing and imaging myself where available.  Lab Results  Component Value Date   WBC 9.3 06/04/2018   HGB 18.3 (H) 06/04/2018   HCT 54.4 (H) 06/04/2018   MCV 94.6 06/04/2018   PLT 300 06/04/2018      Component Value Date/Time   NA 139 06/04/2018 0546   NA 139 04/16/2016 1041   K 4.3 06/04/2018 0546   CL 104 06/04/2018 0546   CO2 26 06/04/2018 0546   GLUCOSE 89 06/04/2018 0546   BUN 6 06/04/2018 0546   BUN 11 04/16/2016 1041   CREATININE 0.91 06/04/2018 0546   CALCIUM 9.8 06/04/2018 0546   PROT 7.4 11/08/2017 0619   PROT 7.0 04/16/2016 1041   ALBUMIN 3.9 11/08/2017 0619    ALBUMIN 4.6 04/16/2016 1041   AST 34 11/08/2017 0619   ALT 20 11/08/2017 0619   ALKPHOS 77 11/08/2017 0619   BILITOT 0.9 11/08/2017 0619   BILITOT 0.4 04/16/2016 1041   GFRNONAA >60 06/04/2018 0546   GFRAA >60 06/04/2018 0546   No results found for: CHOL, HDL, LDLCALC, LDLDIRECT, TRIG, CHOLHDL No results found for: HGBA1C No results found for: VITAMINB12 No results found for: TSH    ASSESSMENT AND PLAN 28 y.o. year old male  has a past medical history of ADD (attention  deficit disorder), Epilepsy (HCC), Generalized convulsive epilepsy without mention of intractable epilepsy (04/15/2013), and Polysubstance abuse (HCC). here with ***   I spent 15 minutes with the patient. 50% of this time was spent   Margie EgeSarah Alisha Bacus, Capon BridgeAGNP-C, DNP 12/19/2018, 4:21 PM [facility] [address], [address] [place], [place] [address] [phone]

## 2018-12-20 ENCOUNTER — Ambulatory Visit: Payer: Self-pay | Admitting: Neurology

## 2018-12-20 ENCOUNTER — Telehealth: Payer: Self-pay

## 2018-12-20 NOTE — Telephone Encounter (Signed)
Patient was a no call/no show for their appointment today.   

## 2019-03-30 ENCOUNTER — Encounter: Payer: Self-pay | Admitting: Pediatric Intensive Care

## 2019-04-04 NOTE — Congregational Nurse Program (Signed)
  Dept: [phone]   Congregational Nurse Program Note  Date of Encounter: 03/30/2019  Past Medical History: Past Medical History:  Diagnosis Date  . ADD (attention deficit disorder)   . Epilepsy (HCC)   . 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 Polysubstance abuse (HCC)    Heroin, marijuana abuse    Encounter Details: Follow up visit for client. He has completed [employer]. Client would like referral to PCP as he needs source for his medication to control seizures. He has been a patient at [facility] in the past but missed his last appointment due to no funds for appointment. CN advises establish PCP. Shann Medal RN BSN CNP [phone]

## 2019-04-28 ENCOUNTER — Ambulatory Visit: Payer: Self-pay | Admitting: Internal Medicine

## 2019-11-17 ENCOUNTER — Emergency Department (HOSPITAL_COMMUNITY): Payer: Medicaid Other

## 2019-11-17 ENCOUNTER — Other Ambulatory Visit: Payer: Self-pay

## 2019-11-17 ENCOUNTER — Emergency Department (HOSPITAL_COMMUNITY)
Admission: EM | Admit: 2019-11-17 | Discharge: 2019-11-17 | Disposition: A | Discharge status: Home / Self Care | Payer: Medicaid Other | Attending: Emergency Medicine | Admitting: Emergency Medicine

## 2019-11-17 ENCOUNTER — Encounter (HOSPITAL_COMMUNITY): Payer: Self-pay | Admitting: Emergency Medicine

## 2019-11-17 DIAGNOSIS — S62101A Fracture of unspecified carpal bone, right wrist, initial encounter for closed fracture: Secondary | ICD-10-CM

## 2019-11-17 DIAGNOSIS — F1590 Other stimulant use, unspecified, uncomplicated: Secondary | ICD-10-CM | POA: Insufficient documentation

## 2019-11-17 DIAGNOSIS — S52601A Unspecified fracture of lower end of right ulna, initial encounter for closed fracture: Secondary | ICD-10-CM | POA: Insufficient documentation

## 2019-11-17 DIAGNOSIS — F17200 Nicotine dependence, unspecified, uncomplicated: Secondary | ICD-10-CM | POA: Insufficient documentation

## 2019-11-17 DIAGNOSIS — S62001A Unspecified fracture of navicular [scaphoid] bone of right wrist, initial encounter for closed fracture: Secondary | ICD-10-CM | POA: Insufficient documentation

## 2019-11-17 MED ORDER — OXYCODONE-ACETAMINOPHEN 5-325 MG PO TABS
2.0000 | ORAL_TABLET | Freq: Once | ORAL | Status: AC
  Administered 2019-11-17: 2 via ORAL
  Filled 2019-11-17: qty 2

## 2019-11-17 MED ORDER — HYDROCODONE-ACETAMINOPHEN 5-325 MG PO TABS: 1.0000 | ORAL_TABLET | Freq: Four times a day (QID) | ORAL | 0 refills | Status: DC | PRN

## 2019-11-17 NOTE — ED Triage Notes (Addendum)
Patient arrives to ED with complaints of right wrist pain & swelling after falling off his skateboard. Pt able to move fingers and has sensation in hand.

## 2019-11-17 NOTE — Progress Notes (Signed)
Orthopedic Tech Progress Note Patient Details:  Edward Brewer Aug 07, 1990 [id number]  Ortho Devices Type of Ortho Device: Sugartong splint, Arm sling Ortho Device/Splint Location: RUE Ortho Device/Splint Interventions: Ordered, Application, Adjustment   Post Interventions Patient Tolerated: Well Instructions Provided: Care of device, Adjustment of device, Poper ambulation with device   [name] 11/17/2019, 6:52 PM

## 2019-11-17 NOTE — Discharge Instructions (Addendum)
You have a wrist fracture and scaphoid fracture.  Take Motrin for pain and apply ice for swelling  Take Hycodan for severe pain and do not drive with it.  Call hand surgery tomorrow for appointment next week.  Return to ER if you have worse wrist pain, finger numbness

## 2019-11-17 NOTE — ED Provider Notes (Signed)
[facility] EMERGENCY DEPARTMENT Provider Note   CSN: [id number] Arrival date & time: 11/17/19  1659     History Chief Complaint  Patient presents with  . Fall  . Wrist Pain    Edward Brewer is a 29 y.o. male history of ADHD, previous polysubstance abuse, presenting with skateboard injury.  Patient states that he fell off his skateboard 2 hours prior to arrival.  He states that he had fell on the outstretched hand.  He noticed right wrist swelling that progressively got worse.  Denies head injury or other injuries.  The history is provided by the patient.       Past Medical History:  Diagnosis Date  . ADD (attention deficit disorder)   . Epilepsy (HCC)   . 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 Polysubstance abuse (HCC)    Heroin, marijuana abuse    Patient Active Problem List   Diagnosis Date Noted  . Encounter for therapeutic drug monitoring 04/17/2015  . 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 No pertinent surgical history.     Family History  Problem Relation Age of Onset  . Diabetes Father   . Seizures Neg Hx     Social History   Tobacco Use  . Smoking status: Current Every Day Smoker    Packs/day: 1.00  . Smokeless tobacco: Never Used  Vaping Use  . Vaping Use: Never used  Substance Use Topics  . Alcohol use: No    Comment: occasional 6 beers a week  . Drug use: Yes    Types: Marijuana, Heroin    Comment: 5 months clean    Home Medications Prior to Admission medications   Medication Sig Start Date End Date Taking? Authorizing Provider  divalproex (DEPAKOTE ER) 500 MG 24 hr tablet Take 2 tablets (1,000 mg total) by mouth 2 (two) times daily. 06/16/18   York Spaniel, MD  ondansetron (ZOFRAN ODT) 4 MG disintegrating tablet Take 1 tablet (4 mg total) by mouth every 8 (eight) hours as needed for nausea or vomiting. Patient not taking: Reported on  06/04/2018 11/08/17   Anselm Pancoast, PA-C    Allergies    Patient has no known allergies.  Review of Systems   Review of Systems  Musculoskeletal:       R wrist pain   All other systems reviewed and are negative.   Physical Exam Updated Vital Signs BP (!) 143/101   Pulse (!) 58   Temp 98.5 F (36.9 C)   Resp 20   SpO2 100%   Physical Exam Vitals and nursing note reviewed.  Constitutional:      Appearance: Normal appearance.  HENT:     Head: Normocephalic and atraumatic.     Nose: Nose normal.     Mouth/Throat:     Mouth: Mucous membranes are moist.  Eyes:     Extraocular Movements: Extraocular movements intact.     Pupils: Pupils are equal, round, and reactive to light.  Cardiovascular:     Rate and Rhythm: Normal rate and regular rhythm.     Pulses: Normal pulses.     Heart sounds: Normal heart sounds.  Pulmonary:     Effort: Pulmonary effort is normal.     Breath sounds: Normal breath sounds.  Abdominal:     General: Abdomen is flat.     Palpations: Abdomen is soft.  Musculoskeletal:     Cervical back: Normal range of motion.  Comments: Right wrist with obvious swelling.  Patient is having difficulty flexing and extending the wrist.  Patient also has some scaphoid tenderness.  No elbow tenderness.  Patient has 2+ radial pulse and able to hand grasp.  Skin:    General: Skin is warm.     Capillary Refill: Capillary refill takes less than 2 seconds.  Neurological:     General: No focal deficit present.     Mental Status: He is alert.  Psychiatric:        Mood and Affect: Mood normal.        Behavior: Behavior normal.     ED Results / Procedures / Treatments   Labs (all labs ordered are listed, but only abnormal results are displayed) Labs Reviewed - No data to display  EKG None  Radiology DG Wrist Complete Right  Result Date: 11/17/2019 CLINICAL DATA:  Right hand pain and swelling EXAM: RIGHT WRIST - COMPLETE 3+ VIEW COMPARISON:  None. FINDINGS:  Four view radiograph right wrist demonstrates a a comminuted fracture of the distal right radius with a transverse fracture plane extending through the metaphysis as well as multiple longitudinal fracture planes extending into the scaphoid and lunate fossa of the distal radial articular surface. There are multiple resultant articular gaps of approximately 2-3 mm. There is resultant neutral tilt of the distal radial articular surface. An associated fracture of the base of the ulnar styloid is seen with mild radial displacement of the ulnar styloid. An additional mildly displaced fracture of the scaphoid waist is present with near anatomic alignment of the fracture fragments. Marked surrounding soft tissue swelling. IMPRESSION: Comminuted intra-articular distal right radial fracture with mild articular incongruity and neutral tilt of the distal radial articular surface. Mildly displaced ulnar styloid base fracture. Anatomically aligned scaphoid waist fracture. Electronically Signed   By: Helyn Numbers MD   On: 11/17/2019 17:34    Procedures Procedures (including critical care time)  Medications Ordered in ED Medications  oxyCODONE-acetaminophen (PERCOCET/ROXICET) 5-325 MG per tablet 2 tablet (has no administration in time range)    ED Course  I have reviewed the triage vital signs and the nursing notes.  Pertinent labs & imaging results that were available during my care of the patient were reviewed by me and considered in my medical decision making (see chart for details).    MDM Rules/Calculators/A&P                         Edward Brewer is a 29 y.o. male who presented with right wrist pain after skateboard accident.  X-ray showed intra-articular right radial fracture and also ulnar fracture and scaphoid fracture.  Patient is placed in sugar tong splint and given shoulder immobilizer.  We will have him follow-up with hand surgery in a week.  He does have a history of previous drug abuse but not  currently on any pain medicine.  We will give him a short course of pain medicine as he has an acute fracture.   Final Clinical Impression(s) / ED Diagnoses Final diagnoses:  None    Rx / DC Orders ED Discharge Orders    None       Charlynne Pander, MD 11/17/19 [phone]

## 2019-11-24 DIAGNOSIS — S52509A Unspecified fracture of the lower end of unspecified radius, initial encounter for closed fracture: Secondary | ICD-10-CM | POA: Insufficient documentation

## 2019-12-21 ENCOUNTER — Other Ambulatory Visit: Payer: Self-pay

## 2019-12-21 ENCOUNTER — Emergency Department (HOSPITAL_COMMUNITY)
Admission: EM | Admit: 2019-12-21 | Discharge: 2019-12-22 | Disposition: A | Discharge status: Home / Self Care | Payer: Medicaid Other | Attending: Emergency Medicine | Admitting: Emergency Medicine

## 2019-12-21 ENCOUNTER — Encounter (HOSPITAL_COMMUNITY): Payer: Self-pay

## 2019-12-21 DIAGNOSIS — Z5321 Procedure and treatment not carried out due to patient leaving prior to being seen by health care provider: Secondary | ICD-10-CM | POA: Insufficient documentation

## 2019-12-21 DIAGNOSIS — K0889 Other specified disorders of teeth and supporting structures: Secondary | ICD-10-CM | POA: Insufficient documentation

## 2019-12-21 NOTE — ED Triage Notes (Signed)
Pt reports that he has been having dental pain on multiple teeth today

## 2019-12-22 ENCOUNTER — Emergency Department (HOSPITAL_COMMUNITY)
Admission: EM | Admit: 2019-12-22 | Discharge: 2019-12-22 | Disposition: A | Discharge status: Home / Self Care | Payer: Self-pay | Attending: Emergency Medicine | Admitting: Emergency Medicine

## 2019-12-22 ENCOUNTER — Encounter (HOSPITAL_COMMUNITY): Payer: Self-pay | Admitting: Emergency Medicine

## 2019-12-22 DIAGNOSIS — F17200 Nicotine dependence, unspecified, uncomplicated: Secondary | ICD-10-CM | POA: Insufficient documentation

## 2019-12-22 DIAGNOSIS — K0889 Other specified disorders of teeth and supporting structures: Secondary | ICD-10-CM | POA: Insufficient documentation

## 2019-12-22 MED ORDER — AMOXICILLIN 500 MG PO CAPS: 500.0000 mg | ORAL_CAPSULE | Freq: Three times a day (TID) | ORAL | 0 refills | Status: DC

## 2019-12-22 MED ORDER — OXYCODONE-ACETAMINOPHEN 5-325 MG PO TABS
1.0000 | ORAL_TABLET | Freq: Once | ORAL | Status: AC
  Administered 2019-12-22: 1 via ORAL
  Filled 2019-12-22: qty 1

## 2019-12-22 NOTE — ED Provider Notes (Signed)
[facility] EMERGENCY DEPARTMENT Provider Note   CSN: [id number] Arrival date & time: 12/22/19  0737     History Chief Complaint  Patient presents with  . Dental Pain    Edward Brewer is a 29 y.o. male who presents to the ED today with complaint of sudden onset, constant, sharp, dental pain to right lower and left upper areas that began yesterday. Pt reports he has history of "bad teeth" and will have pain from time to time. He has seen a dentist in the past however has not seen one recently. Pt states he took Ibuprofen last night without relief. He proceeded to come to the ED last night however LWBS after waiting 5 hours. He returns this morning for same. Pt denies fevers, chills, foul taste in mouth, trismus, facial swelling, or any other associated symptoms.   The history is provided by the patient and medical records.       Past Medical History:  Diagnosis Date  . ADD (attention deficit disorder)   . Epilepsy (HCC)   . 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 Polysubstance abuse (HCC)    Heroin, marijuana abuse    Patient Active Problem List   Diagnosis Date Noted  . Encounter for therapeutic drug monitoring 04/17/2015  . 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 No pertinent surgical history.     Family History  Problem Relation Age of Onset  . Diabetes Father   . Seizures Neg Hx     Social History   Tobacco Use  . Smoking status: Current Every Day Smoker    Packs/day: 1.00  . Smokeless tobacco: Never Used  Vaping Use  . Vaping Use: Never used  Substance Use Topics  . Alcohol use: No    Comment: occasional 6 beers a week  . Drug use: Yes    Types: Marijuana, Heroin    Comment: 5 months clean    Home Medications Prior to Admission medications   Medication Sig Start Date End Date Taking? Authorizing Provider  amoxicillin (AMOXIL) 500 MG capsule Take 1 capsule  (500 mg total) by mouth 3 (three) times daily. 12/22/19   Hyman Hopes, Moyinoluwa Dawe, PA-C  divalproex (DEPAKOTE ER) 500 MG 24 hr tablet Take 2 tablets (1,000 mg total) by mouth 2 (two) times daily. 06/16/18   York Spaniel, MD  HYDROcodone-acetaminophen (NORCO/VICODIN) 5-325 MG tablet Take 1 tablet by mouth every 6 (six) hours as needed. 11/17/19   Charlynne Pander, MD  ondansetron (ZOFRAN ODT) 4 MG disintegrating tablet Take 1 tablet (4 mg total) by mouth every 8 (eight) hours as needed for nausea or vomiting. Patient not taking: Reported on 06/04/2018 11/08/17   Anselm Pancoast, PA-C    Allergies    Patient has no known allergies.  Review of Systems   Review of Systems  Constitutional: Negative for chills and fever.  HENT: Positive for dental problem. Negative for facial swelling, sore throat, trouble swallowing and voice change.     Physical Exam Updated Vital Signs BP (!) 136/96   Pulse 84   Temp 98 F (36.7 C) (Oral)   Resp 18   Ht 5\' 8"  (1.727 m)   Wt 70.3 kg   SpO2 98%   BMI 23.57 kg/m   Physical Exam Vitals and nursing note reviewed.  Constitutional:      Appearance: He is not ill-appearing.  HENT:     Head: Normocephalic and atraumatic.  Mouth/Throat:     Comments: Multiple fractured teeth throughout mouth as well as dental extractions. Tooth #15 and tooth #28 fractured with dental caries and surrounding TTP. No definitive abscess to be drained. No facial swelling or signs or Ludwig's angina. Eyes:     Conjunctiva/sclera: Conjunctivae normal.  Cardiovascular:     Rate and Rhythm: Normal rate and regular rhythm.     Pulses: Normal pulses.     Heart sounds: No murmur heard.   Pulmonary:     Effort: Pulmonary effort is normal.     Breath sounds: Normal breath sounds. No wheezing, rhonchi or rales.  Skin:    General: Skin is warm and dry.     Coloration: Skin is not jaundiced.  Neurological:     Mental Status: He is alert.     ED Results / Procedures / Treatments     Labs (all labs ordered are listed, but only abnormal results are displayed) Labs Reviewed - No data to display  EKG None  Radiology No results found.  Procedures Procedures (including critical care time)  Medications Ordered in ED Medications  oxyCODONE-acetaminophen (PERCOCET/ROXICET) 5-325 MG per tablet 1 tablet (1 tablet Oral Given 12/22/19 3559)    ED Course  I have reviewed the triage vital signs and the nursing notes.  Pertinent labs & imaging results that were available during my care of the patient were reviewed by me and considered in my medical decision making (see chart for details).    MDM Rules/Calculators/A&P                          29 year old male presents to the ED today complaining of dental pain to his right lower and left upper gumline.  History of poor dentition throughout with multiple fractured teeth and multiple dental extractions appreciated.  Patient took ibuprofen last night without relief prompting him to come to the ED.  It appears he left prior to being seen and returned this morning.  On arrival to the ED patient is afebrile, nontachycardic nontachypneic however does appear uncomfortable on exam.  On exam patient has fractured teeth noted to tooth #15 and tooth #28 with surrounding tenderness palpation.  There is no signs of abscess to be drained today no concern for Ludwig's angina.  Will provide dental resource guide for patient as well as antibiotics.  Patient will likely need these teeth extracted.  Have provided a single dose of Percocet in the ED today however did discuss with patient that he will not be prescribed narcotic pain medication for dental pain and he should take ibuprofen and Tylenol.  Patient is in agreement with plan and stable for discharge.   This note was prepared using Dragon voice recognition software and may include unintentional dictation errors due to the inherent limitations of voice recognition software.  Final Clinical  Impression(s) / ED Diagnoses Final diagnoses:  Pain, dental    Rx / DC Orders ED Discharge Orders         Ordered    amoxicillin (AMOXIL) 500 MG capsule  3 times daily        12/22/19 0919           Discharge Instructions     Please take your printed prescription to any pharmacy and get them filled. Take as prescribed.   Attached is a list of dentists in the area. You can follow up with any of them on the list or try to follow up  with either Dr. Lucky Cowboy or Dr. Barbette Merino for further evaluation. You may likely need your teeth extracted however.   Take Ibuprofen 800 mg (4 OTC tablets) every 8 hours and Ibuprofen 1,000 mg (2 OTC tablets) every 8 hours as needed for pain. Do not exceed recommended daily dosages.   Return to the ED for any worsening symptoms         Tanda Rockers, PA-C 12/22/19 [id number]    Virgina Norfolk, DO 12/22/19 1051

## 2019-12-22 NOTE — Discharge Instructions (Signed)
Please take your printed prescription to any pharmacy and get them filled. Take as prescribed.   Attached is a list of dentists in the area. You can follow up with any of them on the list or try to follow up with either Dr. Lucky Cowboy or Dr. Barbette Merino for further evaluation. You may likely need your teeth extracted however.   Take Ibuprofen 800 mg (4 OTC tablets) every 8 hours and Ibuprofen 1,000 mg (2 OTC tablets) every 8 hours as needed for pain. Do not exceed recommended daily dosages.   Return to the ED for any worsening symptoms

## 2019-12-22 NOTE — ED Triage Notes (Signed)
Patient reports dental pain that began last night. States he came here but waited 5 hours an decided to leave since he had not been seen.
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
## 2019-12-22 NOTE — ED Notes (Signed)
Pt reports 10/10 pain in right bottom and top left of mouth with hot/cold sensitivity that began last night. Multiple tooth fractures noted. Reports taking ibuprofen last night for pain.

## 2020-03-09 ENCOUNTER — Emergency Department (HOSPITAL_COMMUNITY): Payer: Self-pay

## 2020-03-09 ENCOUNTER — Other Ambulatory Visit: Payer: Self-pay

## 2020-03-09 ENCOUNTER — Encounter (HOSPITAL_COMMUNITY): Payer: Self-pay

## 2020-03-09 ENCOUNTER — Emergency Department (HOSPITAL_COMMUNITY)
Admission: EM | Admit: 2020-03-09 | Discharge: 2020-03-09 | Disposition: A | Discharge status: Home / Self Care | Payer: Self-pay | Attending: Emergency Medicine | Admitting: Emergency Medicine

## 2020-03-09 DIAGNOSIS — W19XXXA Unspecified fall, initial encounter: Secondary | ICD-10-CM | POA: Insufficient documentation

## 2020-03-09 DIAGNOSIS — R569 Unspecified convulsions: Secondary | ICD-10-CM | POA: Insufficient documentation

## 2020-03-09 DIAGNOSIS — M25512 Pain in left shoulder: Secondary | ICD-10-CM | POA: Insufficient documentation

## 2020-03-09 DIAGNOSIS — F17200 Nicotine dependence, unspecified, uncomplicated: Secondary | ICD-10-CM | POA: Insufficient documentation

## 2020-03-09 LAB — CBC WITH DIFFERENTIAL/PLATELET
Abs Immature Granulocytes: 0.04 10*3/uL (ref 0.00–0.07)
Basophils Absolute: 0 10*3/uL (ref 0.0–0.1)
Basophils Relative: 0 %
Eosinophils Absolute: 0 10*3/uL (ref 0.0–0.5)
Eosinophils Relative: 0 %
HCT: 43.2 % (ref 39.0–52.0)
Hemoglobin: 14.9 g/dL (ref 13.0–17.0)
Immature Granulocytes: 0 %
Lymphocytes Relative: 11 %
Lymphs Abs: 1.3 10*3/uL (ref 0.7–4.0)
MCH: 30.8 pg (ref 26.0–34.0)
MCHC: 34.5 g/dL (ref 30.0–36.0)
MCV: 89.3 fL (ref 80.0–100.0)
Monocytes Absolute: 0.5 10*3/uL (ref 0.1–1.0)
Monocytes Relative: 5 %
Neutro Abs: 9.5 10*3/uL — ABNORMAL HIGH (ref 1.7–7.7)
Neutrophils Relative %: 84 %
Platelets: 343 10*3/uL (ref 150–400)
RBC: 4.84 MIL/uL (ref 4.22–5.81)
RDW: 12.1 % (ref 11.5–15.5)
WBC: 11.4 10*3/uL — ABNORMAL HIGH (ref 4.0–10.5)
nRBC: 0 % (ref 0.0–0.2)

## 2020-03-09 LAB — COMPREHENSIVE METABOLIC PANEL
ALT: 15 U/L (ref 0–44)
AST: 26 U/L (ref 15–41)
Albumin: 3.9 g/dL (ref 3.5–5.0)
Alkaline Phosphatase: 89 U/L (ref 38–126)
Anion gap: 10 (ref 5–15)
BUN: 7 mg/dL (ref 6–20)
CO2: 23 mmol/L (ref 22–32)
Calcium: 9.4 mg/dL (ref 8.9–10.3)
Chloride: 105 mmol/L (ref 98–111)
Creatinine, Ser: 0.96 mg/dL (ref 0.61–1.24)
GFR, Estimated: >60 mL/min (ref >60)
Glucose, Bld: 92 mg/dL (ref 70–99)
Potassium: 4 mmol/L (ref 3.5–5.1)
Sodium: 138 mmol/L (ref 135–145)
Total Bilirubin: 0.4 mg/dL (ref 0.3–1.2)
Total Protein: 6.7 g/dL (ref 6.5–8.1)

## 2020-03-09 LAB — ETHANOL: Alcohol, Ethyl (B): <10 mg/dL (ref <10)

## 2020-03-09 LAB — CBG MONITORING, ED: Glucose-Capillary: 98 mg/dL (ref 70–99)

## 2020-03-09 MED ORDER — DIVALPROEX SODIUM ER 500 MG PO TB24: 1000.0000 mg | ORAL_TABLET | Freq: Two times a day (BID) | ORAL | 0 refills | Status: DC

## 2020-03-09 MED ORDER — DIVALPROEX SODIUM ER 500 MG PO TB24
1000.0000 mg | ORAL_TABLET | Freq: Every day | ORAL | Status: DC
  Administered 2020-03-09: 1000 mg via ORAL
  Filled 2020-03-09: qty 2

## 2020-03-09 MED ORDER — FENTANYL CITRATE (PF) 100 MCG/2ML IJ SOLN
50.0000 ug | Freq: Once | INTRAMUSCULAR | Status: AC
  Administered 2020-03-09: 50 ug via INTRAVENOUS
  Filled 2020-03-09: qty 2

## 2020-03-09 NOTE — ED Notes (Signed)
Marylene Land, pt mother, called for update please call [phone]

## 2020-03-09 NOTE — ED Notes (Signed)
Called pt's mom to come pick up her son. Went over discharge instructions with her and explained that she needed to get his prescription filled for him to take his next dose of depakote in the morning. Pt's mother verbalized understanding.

## 2020-03-09 NOTE — ED Provider Notes (Signed)
[facility] EMERGENCY DEPARTMENT Provider Note   CSN: [id number] Arrival date & time: 03/09/20  1818     History Chief Complaint  Patient presents with  . Seizures    Edward Brewer is a 30 y.o. male with a history of generalized convulsive epilepsy, ADD, polysubstance use disorder.  He is brought to the emergency department by EMS.  Patient reports that yesterday he had 3 seizures that were unwitnessed.  Patient reports that today he was sleeping on the couch at his mother's house and woke up on the floor.  Upon waking he was told that he had a seizure.  States that the seizure was witnessed by his friend and his mother.  He reports that he is prescribed Depakote for seizures however has not taken it for the past month due to cost.  Patient complains of a headache, states he woke up from his seizure with it, rated 9/10 the pain scale, no change in pain since it began, worse with talking.  Also complains of left shoulder pain, reports that this has been hurting since yesterday, pain is constant, no pain when his arm is in any neutral position and unmoving, 10/10 pain with movement of left arm.  Patient is right-hand dominant.  Patient endorses daily marijuana use.  Reports last smoked methamphetamines 2 days prior.  Reports drinking 3 beers yesterday, denies alcohol use today, denies daily alcohol use.    Patient denies neck or back pain, no fevers or chills, no bowel or bladder dysfunction, no numbness or weakness in extremities.    HPI     Past Medical History:  Diagnosis Date  . ADD (attention deficit disorder)   . Epilepsy (HCC)   . 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 Polysubstance abuse (HCC)    Heroin, marijuana abuse    Patient Active Problem List   Diagnosis Date Noted  . Encounter for therapeutic drug monitoring 04/17/2015  . 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 No  pertinent surgical history.     Family History  Problem Relation Age of Onset  . Diabetes Father   . Seizures Neg Hx     Social History   Tobacco Use  . Smoking status: Current Every Day Smoker    Packs/day: 1.00  . Smokeless tobacco: Never Used  Vaping Use  . Vaping Use: Never used  Substance Use Topics  . Alcohol use: No    Comment: occasional 6 beers a week  . Drug use: Yes    Types: Marijuana, Heroin    Comment: 5 months clean    Home Medications Prior to Admission medications   Medication Sig Start Date End Date Taking? Authorizing Provider  amoxicillin (AMOXIL) 500 MG capsule Take 1 capsule (500 mg total) by mouth 3 (three) times daily. 12/22/19   Hyman Hopes, Margaux, PA-C  divalproex (DEPAKOTE ER) 500 MG 24 hr tablet Take 2 tablets (1,000 mg total) by mouth 2 (two) times daily. 06/16/18   York Spaniel, MD  HYDROcodone-acetaminophen (NORCO/VICODIN) 5-325 MG tablet Take 1 tablet by mouth every 6 (six) hours as needed. 11/17/19   Charlynne Pander, MD  ondansetron (ZOFRAN ODT) 4 MG disintegrating tablet Take 1 tablet (4 mg total) by mouth every 8 (eight) hours as needed for nausea or vomiting. Patient not taking: Reported on 06/04/2018 11/08/17   Anselm Pancoast, PA-C    Allergies    Patient has no known allergies.  Review of Systems  Review of Systems  Constitutional: Negative for chills and fever.  Eyes: Negative for visual disturbance.  Respiratory: Negative for shortness of breath.   Cardiovascular: Negative for chest pain.  Gastrointestinal: Negative for abdominal pain, nausea and vomiting.  Genitourinary: Negative for difficulty urinating.  Musculoskeletal: Negative for back pain and neck pain.  Skin: Negative for color change and rash.  Neurological: Positive for seizures and headaches. Negative for dizziness, tremors, syncope, facial asymmetry, speech difficulty, weakness, light-headedness and numbness.  Psychiatric/Behavioral: Negative for confusion.     Physical Exam Updated Vital Signs BP 122/85   Pulse 95   Temp 97.7 F (36.5 C) (Oral)   Resp 12   Ht 5\' 8"  (1.727 m)   Wt 65.8 kg   SpO2 95%   BMI 22.05 kg/m   Physical Exam Vitals and nursing note reviewed.  Constitutional:      General: He is not in acute distress.    Appearance: He is not ill-appearing, toxic-appearing or diaphoretic.  HENT:     Head: Normocephalic.     Comments: Hematoma noted to patient's forehead, superficial abrasion to patient's right cheek Eyes:     General: No scleral icterus.       Right eye: No discharge.        Left eye: No discharge.     Extraocular Movements: Extraocular movements intact.     Pupils: Pupils are equal, round, and reactive to light.  Cardiovascular:     Rate and Rhythm: Normal rate.  Pulmonary:     Effort: Pulmonary effort is normal.  Abdominal:     General: There is no distension.     Palpations: Abdomen is soft.     Tenderness: There is no abdominal tenderness.  Musculoskeletal:     Right shoulder: No swelling, deformity, tenderness or bony tenderness.     Left shoulder: Tenderness and bony tenderness present. No swelling or deformity.     Right upper arm: No swelling, deformity, tenderness or bony tenderness.     Left upper arm: No swelling, deformity, tenderness or bony tenderness.     Right elbow: No swelling or deformity. Normal range of motion. No tenderness.     Left elbow: No swelling or deformity. Normal range of motion. No tenderness.     Right forearm: No swelling, deformity, tenderness or bony tenderness.     Left forearm: No swelling, deformity, tenderness or bony tenderness.     Right wrist: No swelling, deformity, tenderness or bony tenderness. Normal range of motion. Normal pulse.     Left wrist: No swelling, deformity, tenderness or bony tenderness. Normal range of motion. Normal pulse.     Right hand: No swelling or deformity. Normal sensation.     Left hand: No swelling or deformity. Normal  sensation.     Cervical back: Normal range of motion and neck supple. No deformity, rigidity, tenderness or bony tenderness.     Thoracic back: No deformity, tenderness or bony tenderness.     Lumbar back: No deformity, tenderness or bony tenderness.     Comments: Tenderness to left scapula and humeral head; complains of pain on range of motion to left shoulder  Skin:    General: Skin is warm and dry.  Neurological:     General: No focal deficit present.     Mental Status: He is alert and oriented to person, place, and time.     GCS: GCS eye subscore is 4. GCS verbal subscore is 5. GCS motor subscore is 6.  Cranial Nerves: No cranial nerve deficit or facial asymmetry.     Sensory: Sensation is intact.     Motor: No weakness, tremor, seizure activity or pronator drift.     Coordination: Finger-Nose-Finger Test normal.     Gait: Gait is intact.     Comments: CN II-XII intact, equal grip strength, +5 strength to right upper extremity,  bilateral upper and lower extremities, +5 strength to dorsiflexion and plantarflexion, patient able to left both legs against gravity and hold each there without difficulty   Psychiatric:        Behavior: Behavior is cooperative.     ED Results / Procedures / Treatments   Labs (all labs ordered are listed, but only abnormal results are displayed) Labs Reviewed  CBC WITH DIFFERENTIAL/PLATELET - Abnormal; Notable for the following components:      Result Value   WBC 11.4 (*)    Neutro Abs 9.5 (*)    All other components within normal limits  ETHANOL  COMPREHENSIVE METABOLIC PANEL  RAPID URINE DRUG SCREEN, HOSP PERFORMED  CBG MONITORING, ED    EKG None  Radiology DG Shoulder 1 View Left  Result Date: 03/09/2020 CLINICAL DATA:  Pain, possible dislocation. Multiple seizures and falls. EXAM: LEFT SHOULDER COMPARISON:  Left shoulder radiographs earlier today. FINDINGS: Axillary view was obtained to accompany earlier shoulder series. No evidence of  fracture or dislocation. IMPRESSION: Negative. Electronically Signed   By: Charlett Nose M.D.   On: 03/09/2020 19:50   DG Shoulder Left  Result Date: 03/09/2020 CLINICAL DATA:  Left shoulder pain. Possible dislocation. Several seizures. Multiple falls. EXAM: LEFT SHOULDER - 2+ VIEW COMPARISON:  Radiograph 09/11/2013 FINDINGS: Slight contour deformity of the lateral humeral head may represent till Sachs impaction injury, age indeterminate but not definitively seen in 2015. No other evidence of fracture. Humeral head may be a slightly posteriorly located with respect to the glenoid, best assessed on the transscapular Y-view. Acromioclavicular joint is congruent. No acute findings in the included left hemithorax. IMPRESSION: 1. Slight contour deformity of the lateral humeral head may represent Hill-Sachs impaction injury from anterior dislocation, age indeterminate but not definitively seen in 2015. 2. Possible mild posterior subluxation of the humeral head with respect to the glenoid, best assessed on the transscapular Y-view. Consider follow-up axillary view if patient is able for glenohumeral assessment. Electronically Signed   By: Narda Rutherford M.D.   On: 03/09/2020 19:17    Procedures Procedures   Medications Ordered in ED Medications - No data to display  ED Course  I have reviewed the triage vital signs and the nursing notes.  Pertinent labs & imaging results that were available during my care of the patient were reviewed by me and considered in my medical decision making (see chart for details).    MDM Rules/Calculators/A&P                          Alert and oriented 30 year old male in no acute distress, nontoxic-appearing noted to be sitting upright with c-collar on.  Patient said he had 3 seizures yesterday were unwitnessed and one seizure today which was witnessed by his mother and friend.  Reports that today went to sleep and woke up on the floor, bystanders reported to him that he  had a seizure.  Patient was prescribed Depakote for seizures however has not had this medication for the past month due to cost.  Patient complains of headache which has been present since  waking from a seizure and left shoulder pain has been present since yesterday.    Patient denies any neck or back pain, no numbness or weakness in extremities, no bowel or bladder dysfunction.  No focal neurological deficits noted however patient's left shoulder pain made it difficult to assess left upper arm strength.  Grip strength was noted to be equal.    Patient had no neck or back pain, no tender spinous process.  Patient endorsed tenderness to left scapula and left humeral head.  C-collar was removed.  Attempted to contact patient's mother via telephone however unable to reach her.    CBC, CMP, UDS, POC CBG, left shoulder x-ray ordered and pending.    CBG 98, CBC shows slight leukocytosis at 11.5, CMP is unremarkable.  X-ray of left shoulder showed no acute fractures or dislocations.    Patient was given dose of his Depakote.  On serial reexamination patient had no altered mental status, no reported seizure activity.  Patient was given 1 month prescription for Depakote and told to follow-up with his neurologist for further medication management.  Patient was informed not to drive or operate heavy machinery until cleared by neurologist.  Patient was told to contact orthopedic if left shoulder pain does not improve.  Patient was told to follow-up with his primary care provider for management and referral for treatment of substance use disorder.  Discussed results, findings, treatment and follow up. Patient advised of return precautions. Patient verbalized understanding and agreed with plan.  Patient was discussed with and evaluated by Dr. Stevie Kern.   Final Clinical Impression(s) / ED Diagnoses Final diagnoses:  Seizure (HCC)  Acute pain of left shoulder    Rx / DC Orders ED Discharge Orders          Ordered    divalproex (DEPAKOTE ER) 500 MG 24 hr tablet  2 times daily       Note to Pharmacy: Must keep FU 03/30/18, 7:45 am   03/09/20 2134           Haskel Schroeder, PA-C 03/10/20 0024    Milagros Loll, MD 03/10/20 0040

## 2020-03-09 NOTE — Discharge Instructions (Addendum)
You came to the emergency department after suffering a seizure. Your physical exam and labs were reassuring.  You were given 1000 mg of Depakote while in the emergency department.  You were also given a prescription for 30 days worth of your Depakote.  Please fill this prescription and take as prescribed.  You also need to follow-up with your neurologist for long-term medical management of your seizures.  With your recent seizures please do not drive or operate heavy machinery till cleared by neurologist.  Bonita Quin also complained of some left shoulder pain while in the emergency department.  Your x-rays showed no fractures or dislocations.  I have given you information to follow-up with the orthopedic doctor for further assessment of your left shoulder pain.  You were given opiate pain medication while in the emergency room, which can cause drowsiness.  For the next 24 hours please do not drive, operate heavy machinery, care for a small child with out another adult present, or perform any activities that may cause harm to you or someone else if you were to fall asleep or be impaired.   Turn to the emergency department if Your arm, hand, or fingers: Tingle. Become numb. Become swollen. Become painful. Turn white or blue. 2. You have: A seizure that does not stop after 5 minutes. Several seizures in a row without a complete recovery between seizures. A seizure that makes it harder to breathe. A seizure that leaves you unable to speak or use a part of your body. 3. You do not wake up right away after a seizure. 4. You injure yourself during a seizure. 5. You have confusion or pain right after a seizure.

## 2020-03-09 NOTE — ED Notes (Signed)
Patient transported to X-ray 

## 2020-03-09 NOTE — ED Notes (Signed)
Reviewed discharge instructions with patient and mother. Follow-up care and medications reviewed. Patient and mother verbalized understanding. Patient A&Ox4, VSS, and ambulatory with steady gait upon discharge.  

## 2020-03-09 NOTE — ED Triage Notes (Signed)
Pt arrived to ED via EMS from mother's house for a witnessed seizure. Pt lives in his car and had multiple seizures today while at the [facility] park, but the witnessed one was at his mom's house. Pt is not postictal upon arrival to ED. Pt was placed in C-collar by EMS. Pt c/o L arm pain and head pain after fall from seizure. Pt is out of his seizure medications d/t he has bills to pay his neurologist and they refuse to fill his meds until bills are paid. Pt has not had meds in about a month or 2 per pt.  VSS EMS IV 20 L AC

## 2020-03-26 ENCOUNTER — Telehealth: Payer: Self-pay | Admitting: Neurology

## 2020-03-26 NOTE — Telephone Encounter (Signed)
Patient's mom called back and I offered her 3/29 @ 0800, she accepted.  Aware to arrive by 0730.

## 2020-03-26 NOTE — Telephone Encounter (Signed)
LVM for patient or mom to call back regarding appointment.

## 2020-03-26 NOTE — Telephone Encounter (Signed)
Pt's mother called today stating that her son needs an appt by the end of the month due to him running out of meds the end of this month. If she can't get an appt by the end of the month she was wondering if the could write the prescription until he can get in. Best call back is [phone]

## 2020-04-05 ENCOUNTER — Other Ambulatory Visit: Payer: Self-pay | Admitting: Emergency Medicine

## 2020-04-05 ENCOUNTER — Telehealth: Payer: Self-pay | Admitting: Neurology

## 2020-04-05 MED ORDER — DIVALPROEX SODIUM ER 500 MG PO TB24: 1000.0000 mg | ORAL_TABLET | Freq: Two times a day (BID) | ORAL | 0 refills | Status: DC

## 2020-04-05 NOTE — Telephone Encounter (Signed)
Pt's mother has called for the script to be written for divalproex (DEPAKOTE ER) 500 MG 24 hr tablet Good Rx

## 2020-04-05 NOTE — Telephone Encounter (Signed)
Pt's mother called back and requested for the prescription to be sent in to the [facility] at [facility]. Joellyn Quails. [phone]

## 2020-05-08 ENCOUNTER — Ambulatory Visit: Payer: Self-pay | Admitting: Neurology

## 2020-05-08 ENCOUNTER — Other Ambulatory Visit: Payer: Self-pay

## 2020-05-08 ENCOUNTER — Encounter: Payer: Self-pay | Admitting: Neurology

## 2020-05-08 VITALS — Ht 69.0 in | Wt 171.6 lb

## 2020-05-08 DIAGNOSIS — F1910 Other psychoactive substance abuse, uncomplicated: Secondary | ICD-10-CM

## 2020-05-08 DIAGNOSIS — Z5181 Encounter for therapeutic drug level monitoring: Secondary | ICD-10-CM

## 2020-05-08 DIAGNOSIS — G40309 Generalized idiopathic epilepsy and epileptic syndromes, not intractable, without status epilepticus: Secondary | ICD-10-CM

## 2020-05-08 MED ORDER — DIVALPROEX SODIUM ER 500 MG PO TB24: 1000.0000 mg | ORAL_TABLET | Freq: Two times a day (BID) | ORAL | 3 refills | Status: DC

## 2020-05-08 NOTE — Progress Notes (Signed)
Reason for visit: Seizures, polysubstance abuse  Edward Brewer is an 30 y.o. male  History of present illness:  Edward Brewer is a 30 year old right-handed white male with a history of seizures with medical noncompliance and polysubstance abuse.  His polysubstance abuse at one time another has included alcohol, marijuana, cocaine, LSD, and methamphetamine.  He smokes marijuana on a daily basis.  He was in [facility] hospital 09 March 2020 with multiple seizures after he had been off of his Depakote for 2 months and then drank 1/5 of liquor Edward night before.  He has a suspended license, but he continues to drive intermittently.  He does not work, he lives with his mother.  Edward Brewer claims that he has been off of alcohol and drugs with exception of marijuana since January 2022.  He has not had any further seizures, he is back on Depakote.  Edward Brewer reports a right wrist fracture following a fall about a year ago and has had some residual tingling of Edward tip of Edward ring finger on Edward right hand.  Edward Brewer otherwise has been doing well.  He returns for further evaluation.  Past Medical History:  Diagnosis Date  . ADD (attention deficit disorder)   . Epilepsy (HCC)   . 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 Polysubstance abuse (HCC)    Heroin, marijuana abuse    No past surgical history on file.  Family History  Problem Relation Age of Onset  . Diabetes Father   . Seizures Neg Hx     Social history:  reports that he has been smoking. He has been smoking about 1.00 pack per day. He has never used smokeless tobacco. He reports current drug use. Drugs: Marijuana and Heroin. He reports that he does not drink alcohol.   No Known Allergies  Medications:  Prior to Admission medications   Medication Sig Start Date End Date Taking? Authorizing Provider  amoxicillin (AMOXIL) 500 MG capsule Take 1 capsule (500 mg total) by mouth 3 (three) times  daily. 12/22/19   Hyman Hopes, Margaux, PA-C  divalproex (DEPAKOTE ER) 500 MG 24 hr tablet Take 2 tablets (1,000 mg total) by mouth 2 (two) times daily. 04/05/20   York Spaniel, MD  HYDROcodone-acetaminophen (NORCO/VICODIN) 5-325 MG tablet Take 1 tablet by mouth every 6 (six) hours as needed. 11/17/19   Charlynne Pander, MD  ondansetron (ZOFRAN ODT) 4 MG disintegrating tablet Take 1 tablet (4 mg total) by mouth every 8 (eight) hours as needed for nausea or vomiting. Brewer not taking: Reported on 06/04/2018 11/08/17   Joy, Ines Bloomer C, PA-C    ROS:  Out of a complete 14 system review of symptoms, Edward Brewer complains only of Edward following symptoms, and all other reviewed systems are negative.  Seizures  Height 5\' 9"  (1.753 m), weight 171 lb 9.6 oz (77.8 kg).  Physical Exam  General: Edward Brewer is alert and cooperative at Edward time of Edward examination.  Skin: No significant peripheral edema is noted.   Neurologic Exam  Mental status: Edward Brewer is alert and oriented x 3 at Edward time of Edward examination. Edward Brewer has apparent normal recent and remote memory, with an apparently normal attention span and concentration ability.   Cranial nerves: Facial symmetry is present. Speech is normal, no aphasia or dysarthria is noted. Extraocular movements are full. Visual fields are full.  Motor: Edward Brewer has good strength in all 4 extremities.  Sensory examination: Soft  touch sensation is symmetric on Edward face, arms, and legs.  Coordination: Edward Brewer has good finger-nose-finger and heel-to-shin bilaterally.  Gait and station: Edward Brewer has a normal gait. Tandem gait is normal. Romberg is negative. No drift is seen.  Reflexes: Deep tendon reflexes are symmetric.   Assessment/Plan:  1.  History of seizures  2.  Polysubstance abuse  Edward Brewer was instructed not to operate a motor vehicle for 6 months following a seizure.  He continues to drive even with a suspended license.  He  was given a prescription for Edward Depakote, he will have blood work done today.  He will follow-up and 6 months.  Marlan Palau MD 05/08/2020 8:00 AM  [facility] [address] [place], [place] [address]  Phone [phone] Fax [phone]

## 2020-05-09 ENCOUNTER — Telehealth: Payer: Self-pay | Admitting: *Deleted

## 2020-05-09 LAB — CBC WITH DIFFERENTIAL/PLATELET
Basophils Absolute: 0 10*3/uL (ref 0.0–0.2)
Basos: 1 %
EOS (ABSOLUTE): 0.1 10*3/uL (ref 0.0–0.4)
Eos: 2 %
Hematocrit: 47.9 % (ref 37.5–51.0)
Hemoglobin: 16.1 g/dL (ref 13.0–17.7)
Immature Grans (Abs): 0 10*3/uL (ref 0.0–0.1)
Immature Granulocytes: 1 %
Lymphocytes Absolute: 1.7 10*3/uL (ref 0.7–3.1)
Lymphs: 31 %
MCH: 30.8 pg (ref 26.6–33.0)
MCHC: 33.6 g/dL (ref 31.5–35.7)
MCV: 92 fL (ref 79–97)
Monocytes Absolute: 0.5 10*3/uL (ref 0.1–0.9)
Monocytes: 9 %
Neutrophils Absolute: 3.2 10*3/uL (ref 1.4–7.0)
Neutrophils: 56 %
Platelets: 233 10*3/uL (ref 150–450)
RBC: 5.22 x10E6/uL (ref 4.14–5.80)
RDW: 13.4 % (ref 11.6–15.4)
WBC: 5.5 10*3/uL (ref 3.4–10.8)

## 2020-05-09 LAB — COMPREHENSIVE METABOLIC PANEL
ALT: 11 IU/L (ref 0–44)
AST: 21 IU/L (ref 0–40)
Albumin/Globulin Ratio: 2 (ref 1.2–2.2)
Albumin: 4.7 g/dL (ref 4.0–5.0)
Alkaline Phosphatase: 75 IU/L (ref 44–121)
BUN/Creatinine Ratio: 10 (ref 9–20)
BUN: 10 mg/dL (ref 6–20)
Bilirubin Total: 0.3 mg/dL (ref 0.0–1.2)
CO2: 23 mmol/L (ref 20–29)
Calcium: 9.7 mg/dL (ref 8.7–10.2)
Chloride: 101 mmol/L (ref 96–106)
Creatinine, Ser: 0.99 mg/dL (ref 0.76–1.27)
Globulin, Total: 2.4 g/dL (ref 1.5–4.5)
Glucose: 45 mg/dL — ABNORMAL LOW (ref 65–99)
Potassium: 4.3 mmol/L (ref 3.5–5.2)
Sodium: 142 mmol/L (ref 134–144)
Total Protein: 7.1 g/dL (ref 6.0–8.5)
eGFR: 104 mL/min/{1.73_m2} (ref >59)

## 2020-05-09 LAB — VALPROIC ACID LEVEL: Valproic Acid Lvl: 103 ug/mL — ABNORMAL HIGH (ref 50–100)

## 2020-05-09 NOTE — Telephone Encounter (Signed)
Called patient, advised blood work is unremarkable with exception that the blood glucose level is low, valproic acid level is therapeutic, no change in dosing recommended. Patient verbalized understanding, appreciation.

## 2020-11-08 ENCOUNTER — Encounter: Payer: Self-pay | Admitting: Neurology

## 2020-11-08 ENCOUNTER — Telehealth: Payer: Self-pay | Admitting: Neurology

## 2020-11-08 ENCOUNTER — Ambulatory Visit (INDEPENDENT_AMBULATORY_CARE_PROVIDER_SITE_OTHER): Payer: Self-pay | Admitting: Neurology

## 2020-11-08 VITALS — BP 126/77 | HR 71 | Ht 68.0 in | Wt 166.0 lb

## 2020-11-08 DIAGNOSIS — G40309 Generalized idiopathic epilepsy and epileptic syndromes, not intractable, without status epilepticus: Secondary | ICD-10-CM

## 2020-11-08 DIAGNOSIS — Z5181 Encounter for therapeutic drug level monitoring: Secondary | ICD-10-CM

## 2020-11-08 NOTE — Progress Notes (Signed)
    Reason for visit: Seizures, polysubstance abuse, medical noncompliance  Edward Brewer is an 30 y.o. male  History of present illness:  Edward Brewer is a 30 year old right-handed white male with a history of seizures.  The patient in the past has had issues with medical noncompliance with recurrent seizures.  He indicates that he has not had any seizures since last seen, he has been taking his medications as prescribed.  He is tolerating the Depakote well, he has some slight tremor in the morning and he takes his medication, but otherwise he does fairly well on the medication.  He returns for further evaluation.  Past Medical History:  Diagnosis Date   ADD (attention deficit disorder)    Epilepsy (HCC)    Generalized convulsive epilepsy without mention of intractable epilepsy 04/15/2013   Polysubstance abuse (HCC)    Heroin, marijuana abuse    No past surgical history on file.  Family History  Problem Relation Age of Onset   Diabetes Father    Seizures Neg Hx     Social history:  reports that he has been smoking. He has been smoking an average of 1 pack per day. He has never used smokeless tobacco. He reports current drug use. Drugs: Marijuana and Heroin. He reports that he does not drink alcohol.   No Known Allergies  Medications:  Prior to Admission medications   Medication Sig Start Date End Date Taking? Authorizing Provider  divalproex (DEPAKOTE ER) 500 MG 24 hr tablet Take 2 tablets (1,000 mg total) by mouth 2 (two) times daily. 05/08/20  Yes York Spaniel, MD    ROS:  Out of a complete 14 system review of symptoms, the patient complains only of the following symptoms, and all other reviewed systems are negative.  Tremor Seizures  Blood pressure 126/77, pulse 71, height 5\' 8"  (1.727 m), weight 166 lb (75.3 kg).  Physical Exam  General: The patient is alert and cooperative at the time of the examination.  Skin: No significant peripheral edema is  noted.   Neurologic Exam  Mental status: The patient is alert and oriented x 3 at the time of the examination. The patient has apparent normal recent and remote memory, with an apparently normal attention span and concentration ability.   Cranial nerves: Facial symmetry is present. Speech is normal, no aphasia or dysarthria is noted. Extraocular movements are full. Visual fields are full.  Motor: The patient has good strength in all 4 extremities.  Sensory examination: Soft touch sensation is symmetric on the face, arms, and legs.  Coordination: The patient has good finger-nose-finger and heel-to-shin bilaterally.  Gait and station: The patient has a normal gait. Tandem gait is normal. Romberg is negative. No drift is seen.  Reflexes: Deep tendon reflexes are symmetric.   Assessment/Plan:  1.  History of seizures  2.  Polysubstance abuse  The patient is doing well on the Depakote, he has not had any seizures since last seen.  At this point, he may return to driving.  We will check blood work today.  He will follow-up in 6 months.  In the future, he can be seen through Dr. .  Teresa Coombs MD 11/08/2020 9:07 AM  [facility] [address] [place], [place]  Phone [phone] Fax [phone]

## 2020-11-08 NOTE — Telephone Encounter (Signed)
This is the second revisit no-show for this patient, last no showed on 20 December 2019.

## 2020-11-09 LAB — CBC WITH DIFFERENTIAL/PLATELET
Basophils Absolute: 0 10*3/uL (ref 0.0–0.2)
Basos: 1 %
EOS (ABSOLUTE): 0.1 10*3/uL (ref 0.0–0.4)
Eos: 3 %
Hematocrit: 46.6 % (ref 37.5–51.0)
Hemoglobin: 16.3 g/dL (ref 13.0–17.7)
Immature Grans (Abs): 0 10*3/uL (ref 0.0–0.1)
Immature Granulocytes: 0 %
Lymphocytes Absolute: 1.3 10*3/uL (ref 0.7–3.1)
Lymphs: 34 %
MCH: 33.1 pg — ABNORMAL HIGH (ref 26.6–33.0)
MCHC: 35 g/dL (ref 31.5–35.7)
MCV: 95 fL (ref 79–97)
Monocytes Absolute: 0.4 10*3/uL (ref 0.1–0.9)
Monocytes: 10 %
Neutrophils Absolute: 2.1 10*3/uL (ref 1.4–7.0)
Neutrophils: 52 %
Platelets: 239 10*3/uL (ref 150–450)
RBC: 4.92 x10E6/uL (ref 4.14–5.80)
RDW: 12.1 % (ref 11.6–15.4)
WBC: 4 10*3/uL (ref 3.4–10.8)

## 2020-11-09 LAB — COMPREHENSIVE METABOLIC PANEL
ALT: 26 IU/L (ref 0–44)
AST: 39 IU/L (ref 0–40)
Albumin/Globulin Ratio: 2.2 (ref 1.2–2.2)
Albumin: 5 g/dL (ref 4.0–5.0)
Alkaline Phosphatase: 105 IU/L (ref 44–121)
BUN/Creatinine Ratio: 9 (ref 9–20)
BUN: 10 mg/dL (ref 6–20)
Bilirubin Total: 0.5 mg/dL (ref 0.0–1.2)
CO2: 25 mmol/L (ref 20–29)
Calcium: 10.2 mg/dL (ref 8.7–10.2)
Chloride: 100 mmol/L (ref 96–106)
Creatinine, Ser: 1.12 mg/dL (ref 0.76–1.27)
Globulin, Total: 2.3 g/dL (ref 1.5–4.5)
Glucose: 65 mg/dL — ABNORMAL LOW (ref 70–99)
Potassium: 4.6 mmol/L (ref 3.5–5.2)
Sodium: 142 mmol/L (ref 134–144)
Total Protein: 7.3 g/dL (ref 6.0–8.5)
eGFR: 90 mL/min/{1.73_m2} (ref >59)

## 2020-11-09 LAB — VALPROIC ACID LEVEL: Valproic Acid Lvl: 86 ug/mL (ref 50–100)

## 2020-11-12 ENCOUNTER — Telehealth: Payer: Self-pay

## 2020-11-12 NOTE — Telephone Encounter (Signed)
-----   Message from York Spaniel, MD sent at 11/09/2020  7:40 AM EDT ----- Blood work is unremarkable exception of a slightly low glucose level, Depakote level is in therapeutic range, no change in medical therapy is recommended.  CBC is unremarkable. Please call the patient. ----- Message ----- From: Nell Range Lab Results In Sent: 11/09/2020   7:37 AM EDT To: York Spaniel, MD

## 2020-11-12 NOTE — Telephone Encounter (Signed)
I called patient to discuss. No answer, left a message asking him to call me back. °

## 2020-11-12 NOTE — Telephone Encounter (Signed)
I called patient.  I discussed his lab results. Patient will follow up as scheduled. Pt verbalized understanding of results. Pt had no questions at this time but was encouraged to call back if questions arise.

## 2021-05-08 ENCOUNTER — Ambulatory Visit: Payer: Self-pay | Admitting: Neurology

## 2021-05-08 ENCOUNTER — Encounter: Payer: Self-pay | Admitting: Neurology

## 2021-05-08 NOTE — Progress Notes (Deleted)
? ? ?PATIENT: Edward Brewer ?DOB: 1990-02-20 ? ?REASON FOR VISIT: Follow up for seizures  ?HISTORY FROM: Patient ?PRIMARY NEUROLOGIST: Dr. April Manson ? ? ?ASSESSMENT AND PLAN ?31 y.o. year old male  ? ?1.  History of seizures ?2.  Polysubstance abuse ? ?HISTORY OF PRESENT ILLNESS: ?Today 05/08/21 ?Edward Brewer here today for follow-up. Last visit in Sept 2022 showed Depakote level 86, CMP/CBC unremarkable.  ? ?HISTORY  ?11/08/2020 Dr. Jannifer Franklin: Edward Brewer is a 31 year old right-handed white male with a history of seizures.  The patient in the past has had issues with medical noncompliance with recurrent seizures.  He indicates that he has not had any seizures since last seen, he has been taking his medications as prescribed.  He is tolerating the Depakote well, he has some slight tremor in the morning and he takes his medication, but otherwise he does fairly well on the medication.  He returns for further evaluation. ? ?REVIEW OF SYSTEMS: Out of a complete 14 system review of symptoms, the patient complains only of the following symptoms, and all other reviewed systems are negative. ? ? See HPI ? ?ALLERGIES: ?No Known Allergies ? ?HOME MEDICATIONS: ?Outpatient Medications Prior to Visit  ?Medication Sig Dispense Refill  ? divalproex (DEPAKOTE ER) 500 MG 24 hr tablet Take 2 tablets (1,000 mg total) by mouth 2 (two) times daily. 360 tablet 3  ? ?No facility-administered medications prior to visit.  ? ? ?PAST MEDICAL HISTORY: ?Past Medical History:  ?Diagnosis Date  ? ADD (attention deficit disorder)   ? Epilepsy ([place])   ? 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 Polysubstance abuse ([place])   ? Heroin, marijuana abuse  ? ? ?PAST SURGICAL HISTORY: ?No past surgical history on file. ? ?FAMILY HISTORY: ?Family History  ?Problem Relation Age of Onset  ? Diabetes Father   ? Seizures Neg Hx   ? ? ?SOCIAL HISTORY: ?Social History  ? ?Socioeconomic History  ? Marital status: Single  ?  Spouse name:  Not on file  ? Number of children: 0  ? Years of education: hs  ? Highest education level: Not on file  ?Occupational History  ? Occupation: unemployed  ?  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 Smoking status: Every Day  ?  Packs/day: 1.00  ?  Types: Cigarettes  ? Smokeless tobacco: Never  ?Vaping Use  ? Vaping Use: Never used  ?Substance and Sexual Activity  ? Alcohol use: No  ?  Comment: 11/08/20 occasional 6 beers a week, 05/08/20 sober x 60 days  ? Drug use: Yes  ?  Types: Marijuana, Heroin  ?  Comment: 11/08/20 "1-2 x week, no other drugs"  ? Sexual activity: Not on file  ?Other Topics Concern  ? Not on file  ?Social History Narrative  ? 11/08/20 has a job, lives with sig other  ? ?Social Determinants of Health  ? ?Financial Resource Strain: Not on file  ?Food Insecurity: Not on file  ?Transportation Needs: Not on file  ?Physical Activity: Not on file  ?Stress: Not on file  ?Social Connections: Not on file  ?Intimate Partner Violence: Not on file  ? ? ?PHYSICAL EXAM ? ?There were no vitals filed for this visit. ?There is no height or weight on file to calculate BMI. ? ?Generalized: Well developed, in no acute distress  ?Neurological examination  ?Mentation: Alert oriented to time, place, history taking. Follows all commands speech and language fluent ?Cranial nerve II-XII: Pupils were equal round reactive  to light. Extraocular movements were full, visual field were full on confrontational test. Facial sensation and strength were normal. Uvula tongue midline. Head turning and shoulder shrug  were normal and symmetric. ?Motor: The motor testing reveals 5 over 5 strength of all 4 extremities. Good symmetric motor tone is noted throughout.  ?Sensory: Sensory testing is intact to soft touch on all 4 extremities. No evidence of extinction is noted.  ?Coordination: Cerebellar testing reveals good finger-nose-finger and heel-to-shin bilaterally.  ?Gait and station: Gait is normal. Tandem gait is normal. Romberg is  negative. No drift is seen.  ?Reflexes: Deep tendon reflexes are symmetric and normal bilaterally.  ? ?DIAGNOSTIC DATA (LABS, IMAGING, TESTING) ?- I reviewed patient records, labs, notes, testing and imaging myself where available. ? ?Lab Results  ?Component Value Date  ? WBC 4.0 11/08/2020  ? HGB 16.3 11/08/2020  ? HCT 46.6 11/08/2020  ? MCV 95 11/08/2020  ? PLT 239 11/08/2020  ? ?   ?Component Value Date/Time  ? NA 142 11/08/2020 0925  ? K 4.6 11/08/2020 0925  ? CL 100 11/08/2020 0925  ? CO2 25 11/08/2020 0925  ? GLUCOSE 65 (L) 11/08/2020 0925  ? GLUCOSE 92 03/09/2020 1928  ? BUN 10 11/08/2020 0925  ? CREATININE 1.12 11/08/2020 0925  ? CALCIUM 10.2 11/08/2020 0925  ? PROT 7.3 11/08/2020 0925  ? ALBUMIN 5.0 11/08/2020 0925  ? AST 39 11/08/2020 0925  ? ALT 26 11/08/2020 0925  ? ALKPHOS 105 11/08/2020 0925  ? BILITOT 0.5 11/08/2020 0925  ? GFRNONAA >60 03/09/2020 1928  ? GFRAA >60 06/04/2018 0546  ? ?No results found for: CHOL, HDL, LDLCALC, LDLDIRECT, TRIG, CHOLHDL ?No results found for: HGBA1C ?No results found for: VITAMINB12 ?No results found for: TSH ? ?Butler Denmark, AGNP-C, DNP  ?[facility] ?[address], [address] ?[place], [place] [address] ?[phone] ? ?

## 2021-05-22 ENCOUNTER — Encounter: Payer: Self-pay | Admitting: Neurology

## 2021-05-22 ENCOUNTER — Ambulatory Visit (INDEPENDENT_AMBULATORY_CARE_PROVIDER_SITE_OTHER): Payer: Self-pay | Admitting: Neurology

## 2021-05-22 VITALS — BP 122/73 | HR 75 | Ht 68.0 in | Wt 181.0 lb

## 2021-05-22 DIAGNOSIS — G40309 Generalized idiopathic epilepsy and epileptic syndromes, not intractable, without status epilepticus: Secondary | ICD-10-CM

## 2021-05-22 MED ORDER — DIVALPROEX SODIUM ER 500 MG PO TB24: 1000.0000 mg | ORAL_TABLET | Freq: Two times a day (BID) | ORAL | 3 refills | Status: DC

## 2021-05-22 NOTE — Patient Instructions (Signed)
Make sure you take the Depakote daily, not to miss any doses  ?Check Depakote level today  ?Call for any seizures ?See you back in 6 months  ?

## 2021-05-22 NOTE — Progress Notes (Signed)
? ?Patient: Edward Brewer ?Date of Birth: 07-03-1990 ? ?Reason for Visit: Follow up for seizures, polysubstance abuse ?History From: Patient ?Primary Neurologist: Dr. Teresa Coombs ? ?ASSESSMENT AND PLAN ?31 y.o. year old male  ? ?1.  History of seizures ?2.  Polysubstance abuse ? ?-Doing well, last seizure was in January 2022 ?-Continue Depakote ER 500 mg, 2 tablets twice daily ?-Check Depakote level due to history of noncompliance ?-Have discussed the importance of taking the medication daily, not to miss any doses ?-Follow-up in 6 months or sooner if needed, call for seizure activity ? ?HISTORY OF PRESENT ILLNESS: ?Today 05/22/21 ?Edward Brewer here today for follow-up. Last visit in Sept 2022 showed Depakote level 86, CMP/CBC unremarkable.  He is on Depakote ER 500 mg, 2 tablets twice daily.  Last seizure was in January 2022 in the setting of medication noncompliance and alcohol use. Tolerating Depakote, mild tremor intermittently in hands. Doesn't miss any doses. Lives with mom. He is back to driving just got back his license. 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 Denies any drug use in over 1 year.  ? ?HISTORY  ?11/08/2020 Dr. Anne Hahn: Mr. Edward Brewer is a 31 year old right-handed white male with a history of seizures.  The patient in the past has had issues with medical noncompliance with recurrent seizures.  He indicates that he has not had any seizures since last seen, he has been taking his medications as prescribed.  He is tolerating the Depakote well, he has some slight tremor in the morning and he takes his medication, but otherwise he does fairly well on the medication.  He returns for further evaluation. ? ?REVIEW OF SYSTEMS: Out of a complete 14 system review of symptoms, the patient complains only of the following symptoms, and all other reviewed systems are negative. ? ? See HPI ? ?ALLERGIES: ?No Known Allergies ? ?HOME MEDICATIONS: ?Outpatient Medications Prior to Visit  ?Medication Sig  Dispense Refill  ? divalproex (DEPAKOTE ER) 500 MG 24 hr tablet Take 2 tablets (1,000 mg total) by mouth 2 (two) times daily. 360 tablet 3  ? ?No facility-administered medications prior to visit.  ? ? ?PAST MEDICAL HISTORY: ?Past Medical History:  ?Diagnosis Date  ? ADD (attention deficit disorder)   ? Epilepsy ([facility])   ? 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 Polysubstance abuse ([facility])   ? Heroin, marijuana abuse  ? ? ?PAST SURGICAL HISTORY: ?History reviewed. No pertinent surgical history. ? ?FAMILY HISTORY: ?Family History  ?Problem Relation Age of Onset  ? Diabetes Father   ? Seizures Neg Hx   ? ? ?SOCIAL HISTORY: ?Social History  ? ?Socioeconomic History  ? Marital status: Single  ?  Spouse name: Not on file  ? Number of children: 0  ? Years of education: hs  ? Highest education level: Not on file  ?Occupational History  ? Occupation: unemployed  ?  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 Smoking status: Every Day  ?  Packs/day: 1.00  ?  Types: Cigarettes  ? Smokeless tobacco: Never  ?Vaping Use  ? Vaping Use: Never used  ?Substance and Sexual Activity  ? Alcohol use: No  ?  Comment: 11/08/20 occasional 6 beers a week, 05/08/20 sober x 60 days  ? Drug use: Yes  ?  Types: Marijuana, Heroin  ?  Comment: 11/08/20 "1-2 x week, no other drugs"  ? Sexual activity: Not on file  ?Other Topics Concern  ? Not on file  ?  Social History Narrative  ? 11/08/20 has a job, lives with sig other  ? ?Social Determinants of Health  ? ?Financial Resource Strain: Not on file  ?Food Insecurity: Not on file  ?Transportation Needs: Not on file  ?Physical Activity: Not on file  ?Stress: Not on file  ?Social Connections: Not on file  ?Intimate Partner Violence: Not on file  ? ? ?PHYSICAL EXAM ? ?Vitals:  ? 05/22/21 0814  ?BP: 122/73  ?Pulse: 75  ?Weight: 181 lb (82.1 kg)  ?Height: 5\' 8"  (1.727 m)  ? ?Body mass index is 27.52 kg/m?. ? ?Generalized: Well developed, in no acute distress  ?Neurological  examination  ?Mentation: Alert oriented to time, place, history taking. Follows all commands speech and language fluent ?Cranial nerve II-XII: Pupils were equal round reactive to light. Extraocular movements were full, visual field were full on confrontational test. Facial sensation and strength were normal.  Head turning and shoulder shrug  were normal and symmetric. ?Motor: The motor testing reveals 5 over 5 strength of all 4 extremities. Good symmetric motor tone is noted throughout.  ?Sensory: Sensory testing is intact to soft touch on all 4 extremities. No evidence of extinction is noted.  ?Coordination: Cerebellar testing reveals good finger-nose-finger and heel-to-shin bilaterally.  No tremor noted. ?Gait and station: Gait is normal.  ?Reflexes: Deep tendon reflexes are symmetric and normal bilaterally.  ? ?DIAGNOSTIC DATA (LABS, IMAGING, TESTING) ?- I reviewed patient records, labs, notes, testing and imaging myself where available. ? ?Lab Results  ?Component Value Date  ? WBC 4.0 11/08/2020  ? HGB 16.3 11/08/2020  ? HCT 46.6 11/08/2020  ? MCV 95 11/08/2020  ? PLT 239 11/08/2020  ? ?   ?Component Value Date/Time  ? NA 142 11/08/2020 0925  ? K 4.6 11/08/2020 0925  ? CL 100 11/08/2020 0925  ? CO2 25 11/08/2020 0925  ? GLUCOSE 65 (L) 11/08/2020 0925  ? GLUCOSE 92 03/09/2020 1928  ? BUN 10 11/08/2020 0925  ? CREATININE 1.12 11/08/2020 0925  ? CALCIUM 10.2 11/08/2020 0925  ? PROT 7.3 11/08/2020 0925  ? ALBUMIN 5.0 11/08/2020 0925  ? AST 39 11/08/2020 0925  ? ALT 26 11/08/2020 0925  ? ALKPHOS 105 11/08/2020 0925  ? BILITOT 0.5 11/08/2020 0925  ? GFRNONAA >60 03/09/2020 1928  ? GFRAA >60 06/04/2018 0546  ? ?No results found for: CHOL, HDL, LDLCALC, LDLDIRECT, TRIG, CHOLHDL ?No results found for: HGBA1C ?No results found for: VITAMINB12 ?No results found for: TSH ? ?06/06/2018, AGNP-C, DNP  ?[facility] ?[address], [address] ?[place], [place] ?[phone] ? ?

## 2021-05-23 ENCOUNTER — Telehealth: Payer: Self-pay | Admitting: *Deleted

## 2021-05-23 LAB — VALPROIC ACID LEVEL: Valproic Acid Lvl: 36 ug/mL — ABNORMAL LOW (ref 50–100)

## 2021-05-23 NOTE — Telephone Encounter (Signed)
I spoke to his mother (on [place]). Discussed lab results and importance of compliance. She will relay the message to her son.  ?

## 2021-05-23 NOTE — Telephone Encounter (Signed)
-----   Message from Suzzanne Cloud, NP sent at 05/23/2021 11:42 AM EDT ----- ?Please call the patient Depakote level is slightly low at 36, I believe he missed his dose yesterday after running out of the medication.  It was refilled yesterday, we have discussed the importance of daily compliance. ?

## 2021-11-25 NOTE — Progress Notes (Deleted)
Patient: Edward Brewer Date of Birth: [birth year]  Reason for Visit: Follow up for seizures, polysubstance abuse History From: Patient Primary Neurologist: Dr. April Manson  ASSESSMENT AND PLAN 31 y.o. year old male   1.  History of seizures 2.  Polysubstance abuse  -Doing well, last seizure was in January 2022 -Continue Depakote ER 500 mg, 2 tablets twice daily -Check Depakote level due to history of noncompliance -Have discussed the importance of taking the medication daily, not to miss any doses -Follow-up in 6 months or sooner if needed, call for seizure activity  HISTORY OF PRESENT ILLNESS: Today 11/25/21 Edward Brewer is here today for follow-up.  Depakote level at last visit in April 2023 was 103.   Update 05/22/21 SS: Edward Brewer here today for follow-up. Last visit in Sept 2022 showed Depakote level 86, CMP/CBC unremarkable.  He is on Depakote ER 500 mg, 2 tablets twice daily.  Last seizure was in January 2022 in the setting of medication noncompliance and alcohol use. Tolerating Depakote, mild tremor intermittently in hands. Doesn't miss any doses. Lives with mom. He is back to driving just got back his license. 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 Denies any drug use in over 1 year.   HISTORY  11/08/2020 Dr. Jannifer Brewer: Edward Brewer is a 31 year old right-handed white male with a history of seizures.  The patient in the past has had issues with medical noncompliance with recurrent seizures.  He indicates that he has not had any seizures since last seen, he has been taking his medications as prescribed.  He is tolerating the Depakote well, he has some slight tremor in the morning and he takes his medication, but otherwise he does fairly well on the medication.  He returns for further evaluation.  REVIEW OF SYSTEMS: Out of a complete 14 system review of symptoms, the patient complains only of the following symptoms, and all other reviewed systems are negative.   See  HPI  ALLERGIES: No Known Allergies  HOME MEDICATIONS: Outpatient Medications Prior to Visit  Medication Sig Dispense Refill   divalproex (DEPAKOTE ER) 500 MG 24 hr tablet Take 2 tablets (1,000 mg total) by mouth 2 (two) times daily. 360 tablet 3   No facility-administered medications prior to visit.    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  FAMILY HISTORY: Family History  Problem Relation Age of Onset   Diabetes Father    Seizures Neg Hx     SOCIAL HISTORY: Social History   Socioeconomic History   Marital status: Single    Spouse name: Not on file   Number of children: 0   Years of education: hs   Highest education level: Not on file  Occupational History   Occupation: unemployed    Comment: [employer]  Tobacco Use   Smoking status: Every Day    Packs/day: 1.00    Types: Cigarettes   Smokeless tobacco: Never  Vaping Use   Vaping Use: Never used  Substance and Sexual Activity   Alcohol use: No    Comment: 11/08/20 occasional 6 beers a week, 05/08/20 sober x 60 days   Drug use: Yes    Types: Marijuana, Heroin    Comment: 11/08/20 "1-2  x week, no other drugs"   Sexual activity: Not on file  Other Topics Concern   Not on file  Social History Narrative   11/08/20 has a job, lives with sig other   Social Determinants of Health   Financial Resource Strain: Not on file  Food Insecurity: Not on file  Transportation Needs: Not on file  Physical Activity: Not on file  Stress: Not on file  Social Connections: Not on file  Intimate Partner Violence: Not on file    PHYSICAL EXAM  There were no vitals filed for this visit.  There is no height or weight on file to calculate  BMI.  Generalized: Well developed, in no acute distress  Neurological examination  Mentation: Alert oriented to time, place, history taking. Follows all commands speech and language fluent Cranial nerve II-XII: Pupils were equal round reactive to light. Extraocular movements were full, visual field were full on confrontational test. Facial sensation and strength were normal.  Head turning and shoulder shrug  were normal and symmetric. Motor: The motor testing reveals 5 over 5 strength of all 4 extremities. Good symmetric motor tone is noted throughout.  Sensory: Sensory testing is intact to soft touch on all 4 extremities. No evidence of extinction is noted.  Coordination: Cerebellar testing reveals good finger-nose-finger and heel-to-shin bilaterally.  No tremor noted. Gait and station: Gait is normal.  Reflexes: Deep tendon reflexes are symmetric and normal bilaterally.   DIAGNOSTIC DATA (LABS, IMAGING, TESTING) - I reviewed patient records, labs, notes, testing and imaging myself where available.  Lab Results  Component Value Date   WBC 4.0 11/08/2020   HGB 16.3 11/08/2020   HCT 46.6 11/08/2020   MCV 95 11/08/2020   PLT 239 11/08/2020      Component Value Date/Time   NA 142 11/08/2020 0925   K 4.6 11/08/2020 0925   CL 100 11/08/2020 0925   CO2 25 11/08/2020 0925   GLUCOSE 65 (L) 11/08/2020 0925   GLUCOSE 92 03/09/2020 1928   BUN 10 11/08/2020 0925   CREATININE 1.12 11/08/2020 0925   CALCIUM 10.2 11/08/2020 0925   PROT 7.3 11/08/2020 0925   ALBUMIN 5.0 11/08/2020 0925   AST 39 11/08/2020 0925   ALT 26 11/08/2020 0925   ALKPHOS 105 11/08/2020 0925   BILITOT 0.5 11/08/2020 0925   GFRNONAA >60 03/09/2020 1928   GFRAA >60 06/04/2018 0546   No results found for: "CHOL", "HDL", "LDLCALC", "LDLDIRECT", "TRIG", "CHOLHDL" No results found for: "HGBA1C" No results found for: "VITAMINB12" No results found for: "TSH"  Evangeline Dakin, DNP  [facility] [address], [place], [place] [address] [phone]

## 2021-11-26 ENCOUNTER — Ambulatory Visit: Payer: Self-pay | Admitting: Neurology

## 2021-11-26 ENCOUNTER — Encounter: Payer: Self-pay | Admitting: Neurology

## 2021-12-25 ENCOUNTER — Ambulatory Visit: Payer: Self-pay | Admitting: Neurology

## 2021-12-25 NOTE — Progress Notes (Unsigned)
Patient: Edward Brewer Date of Birth: 1990-04-05  Reason for Visit: Follow up for seizures, polysubstance abuse History From: Patient Primary Neurologist: Dr. Teresa Coombs  ASSESSMENT AND PLAN 31 y.o. year old male   1.  History of seizures 2.  Polysubstance abuse  -Last seizure was in January 2022 -Continue Depakote ER 500 mg, 2 tablets twice daily -Check routine labs today -Follow-up in 1 year or sooner if needed, discussed the importance of medication compliance  HISTORY OF PRESENT ILLNESS: Today 12/26/21 Edward Brewer is here today for follow-up.  Depakote level at last visit in April 2023 was 36.  He remains on Depakote.  Denies any recent seizures.  He reports mild tremor intermittently in his hands as side effect of Depakote.  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  He drives a car, lives with his mother.  He is looking forward to being eligible for health insurance next year.  No problems or concerns.  Update 05/22/21 SS: Edward Brewer here today for follow-up. Last visit in Sept 2022 showed Depakote level 86, CMP/CBC unremarkable.  He is on Depakote ER 500 mg, 2 tablets twice daily.  Last seizure was in January 2022 in the setting of medication noncompliance and alcohol use. Tolerating Depakote, mild tremor intermittently in hands. Doesn't miss any doses. Lives with mom. He is back to driving just got back his license. 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 Denies any drug use in over 1 year.   HISTORY  11/08/2020 Dr. Anne Hahn: Edward Brewer is a 31 year old right-handed white male with a history of seizures.  The patient in the past has had issues with medical noncompliance with recurrent seizures.  He indicates that he has not had any seizures since last seen, he has been taking his medications as prescribed.  He is tolerating the Depakote well, he has some slight tremor in the morning and he takes his medication, but otherwise he does fairly well  on the medication.  He returns for further evaluation.  REVIEW OF SYSTEMS: Out of a complete 14 system review of symptoms, the patient complains only of the following symptoms, and all other reviewed systems are negative.   See HPI  ALLERGIES: No Known Allergies  HOME MEDICATIONS: Outpatient Medications Prior to Visit  Medication Sig Dispense Refill   divalproex (DEPAKOTE ER) 500 MG 24 hr tablet Take 2 tablets (1,000 mg total) by mouth 2 (two) times daily. 360 tablet 3   No facility-administered medications prior to visit.    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 No pertinent surgical history.  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  Tobacco Use   Smoking status: Every Day    Packs/day: 1.00    Types: Cigarettes   Smokeless tobacco: Never  Vaping Use   Vaping Use:  Never used  Substance and Sexual Activity   Alcohol use: No    Comment: 11/08/20 occasional 6 beers a week, 05/08/20 sober x 60 days   Drug use: Yes    Types: Marijuana, Heroin    Comment: 11/08/20 "1-2 x week, no other drugs"   Sexual activity: Not on file  Other Topics Concern   Not on file  Social History Narrative   11/08/20 has a job, lives with sig other   Social Determinants of Health   Financial Resource Strain: Not on file  Food Insecurity: Not on file  Transportation Needs: Not on file  Physical  Activity: Not on file  Stress: Not on file  Social Connections: Not on file  Intimate Partner Violence: Not on file    PHYSICAL EXAM  Vitals:   12/26/21 0749  BP: 116/79  Pulse: 65  Weight: 163 lb (73.9 kg)  Height: 5\' 9"  (1.753 m)    Body mass index is 24.07 kg/m.  Generalized: Well developed, in no acute distress  Neurological examination  Mentation: Alert oriented to time, place, history taking. Follows all commands speech and language fluent Cranial nerve II-XII: Pupils were equal round reactive to light. Extraocular movements were full, visual field were full on confrontational test. Facial sensation and strength were normal.  Head turning and shoulder shrug  were normal and symmetric. Motor: The motor testing reveals 5 over 5 strength of all 4 extremities. Good symmetric motor tone is noted throughout.  Sensory: Sensory testing is intact to soft touch on all 4 extremities. No evidence of extinction is noted.  Coordination: Cerebellar testing reveals good finger-nose-finger and heel-to-shin bilaterally.  No tremor noted. Gait and station: Gait is normal.  Tandem gait is normal. Reflexes: Deep tendon reflexes are symmetric and normal bilaterally.   DIAGNOSTIC DATA (LABS, IMAGING, TESTING) - I reviewed patient records, labs, notes, testing and imaging myself where available.  Lab Results  Component Value Date   WBC 4.0 11/08/2020   HGB 16.3 11/08/2020   HCT 46.6 11/08/2020   MCV 95 11/08/2020   PLT 239 11/08/2020      Component Value Date/Time   NA 142 11/08/2020 0925   K 4.6 11/08/2020 0925   CL 100 11/08/2020 0925   CO2 25 11/08/2020 0925   GLUCOSE 65 (L) 11/08/2020 0925   GLUCOSE 92 03/09/2020 1928   BUN 10 11/08/2020 0925   CREATININE 1.12 11/08/2020 0925   CALCIUM 10.2 11/08/2020 0925   PROT 7.3 11/08/2020 0925   ALBUMIN 5.0 11/08/2020 0925   AST 39 11/08/2020 0925   ALT 26 11/08/2020 0925   ALKPHOS 105 11/08/2020 0925   BILITOT 0.5 11/08/2020 0925    GFRNONAA >60 03/09/2020 1928   GFRAA >60 06/04/2018 0546   No results found for: "CHOL", "HDL", "LDLCALC", "LDLDIRECT", "TRIG", "CHOLHDL" No results found for: "HGBA1C" No results found for: "VITAMINB12" No results found for: "TSH"  06/06/2018, DNP  [facility] [address], [address] [place], [place] [phone]

## 2021-12-26 ENCOUNTER — Ambulatory Visit (INDEPENDENT_AMBULATORY_CARE_PROVIDER_SITE_OTHER): Payer: Self-pay | Admitting: Neurology

## 2021-12-26 ENCOUNTER — Encounter: Payer: Self-pay | Admitting: Neurology

## 2021-12-26 ENCOUNTER — Other Ambulatory Visit: Payer: Self-pay

## 2021-12-26 VITALS — BP 116/79 | HR 65 | Ht 69.0 in | Wt 163.0 lb

## 2021-12-26 DIAGNOSIS — G40309 Generalized idiopathic epilepsy and epileptic syndromes, not intractable, without status epilepticus: Secondary | ICD-10-CM

## 2021-12-26 MED ORDER — DIVALPROEX SODIUM ER 500 MG PO TB24
1000.0000 mg | ORAL_TABLET | Freq: Two times a day (BID) | ORAL | 3 refills | Status: DC
Start: 2021-12-26 — End: 2023-01-09

## 2021-12-26 NOTE — Addendum Note (Signed)
Addended by: Ann Maki on: 12/26/2021 08:27 AM   Modules accepted: Orders

## 2021-12-26 NOTE — Patient Instructions (Signed)
Continue Depakote, please don't miss any doses Check labs today  Call for any seizures See you back in 1 year

## 2021-12-27 LAB — CBC WITH DIFFERENTIAL/PLATELET
Basophils Absolute: 0 10*3/uL (ref 0.0–0.2)
Basos: 0 %
EOS (ABSOLUTE): 0.1 10*3/uL (ref 0.0–0.4)
Eos: 1 %
Hematocrit: 47.6 % (ref 37.5–51.0)
Hemoglobin: 15.8 g/dL (ref 13.0–17.7)
Immature Grans (Abs): 0 10*3/uL (ref 0.0–0.1)
Immature Granulocytes: 1 %
Lymphocytes Absolute: 1.6 10*3/uL (ref 0.7–3.1)
Lymphs: 23 %
MCH: 31.9 pg (ref 26.6–33.0)
MCHC: 33.2 g/dL (ref 31.5–35.7)
MCV: 96 fL (ref 79–97)
Monocytes Absolute: 0.5 10*3/uL (ref 0.1–0.9)
Monocytes: 7 %
Neutrophils Absolute: 4.7 10*3/uL (ref 1.4–7.0)
Neutrophils: 68 %
Platelets: 202 10*3/uL (ref 150–450)
RBC: 4.95 x10E6/uL (ref 4.14–5.80)
RDW: 13 % (ref 11.6–15.4)
WBC: 7 10*3/uL (ref 3.4–10.8)

## 2021-12-27 LAB — COMPREHENSIVE METABOLIC PANEL
ALT: 11 IU/L (ref 0–44)
AST: 23 IU/L (ref 0–40)
Albumin/Globulin Ratio: 2.1 (ref 1.2–2.2)
Albumin: 4.6 g/dL (ref 4.1–5.1)
Alkaline Phosphatase: 75 IU/L (ref 44–121)
BUN/Creatinine Ratio: 12 (ref 9–20)
BUN: 11 mg/dL (ref 6–20)
Bilirubin Total: <0.2 mg/dL (ref 0.0–1.2)
CO2: 27 mmol/L (ref 20–29)
Calcium: 10 mg/dL (ref 8.7–10.2)
Chloride: 102 mmol/L (ref 96–106)
Creatinine, Ser: 0.91 mg/dL (ref 0.76–1.27)
Globulin, Total: 2.2 g/dL (ref 1.5–4.5)
Glucose: 61 mg/dL — ABNORMAL LOW (ref 70–99)
Potassium: 4.6 mmol/L (ref 3.5–5.2)
Sodium: 141 mmol/L (ref 134–144)
Total Protein: 6.8 g/dL (ref 6.0–8.5)
eGFR: 114 mL/min/{1.73_m2} (ref >59)

## 2021-12-27 LAB — VALPROIC ACID LEVEL: Valproic Acid Lvl: 69 ug/mL (ref 50–100)

## 2021-12-30 ENCOUNTER — Telehealth: Payer: Self-pay

## 2021-12-30 NOTE — Telephone Encounter (Signed)
-----   Message from Glean Salvo, NP sent at 12/30/2021  1:02 PM EST ----- Please call, labs are normal with exception of low glucose of 61, please remind to eat frequent meals, he may have been fasting that morning. Depakote level is therapeutic at 69. Thanks

## 2021-12-30 NOTE — Telephone Encounter (Signed)
Pt verified by name and DOB,  normal results given per provider, pt voiced understanding all question answered. °

## 2022-03-27 IMAGING — CR DG SHOULDER 2+V*L*
3 series · 3 of 3 positions shown · non-contrast
Comparison: Radiograph 09/11/2013

CLINICAL DATA: Left shoulder pain. Possible dislocation. Several
seizures. Multiple falls.

EXAM:
LEFT SHOULDER - 2+ VIEW

[shoulder grashey]
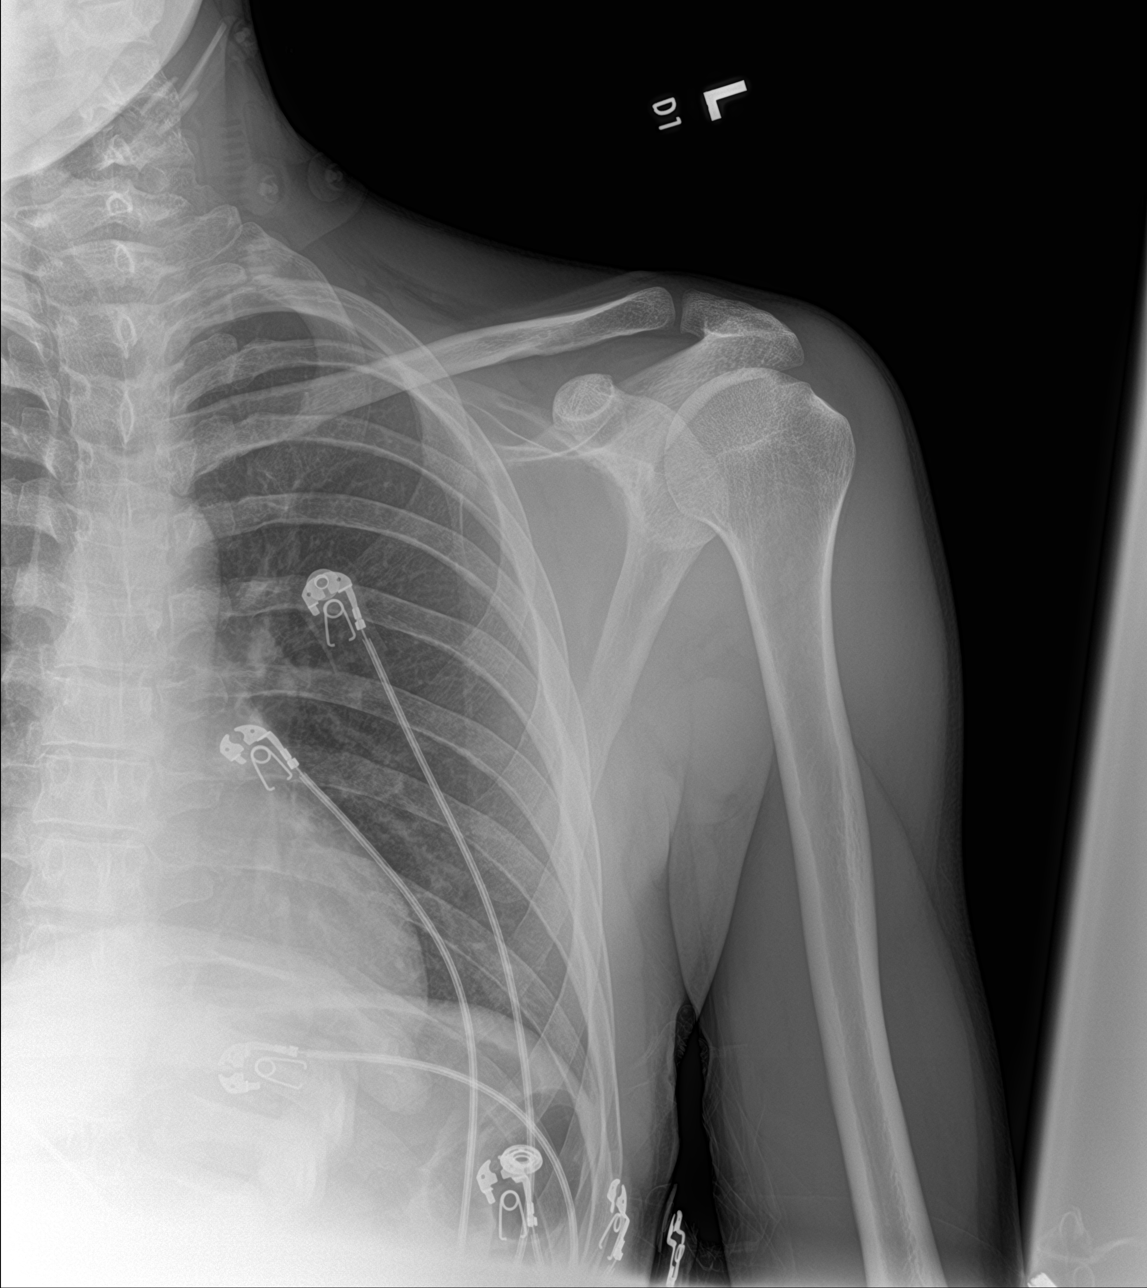

[shoulder y view]
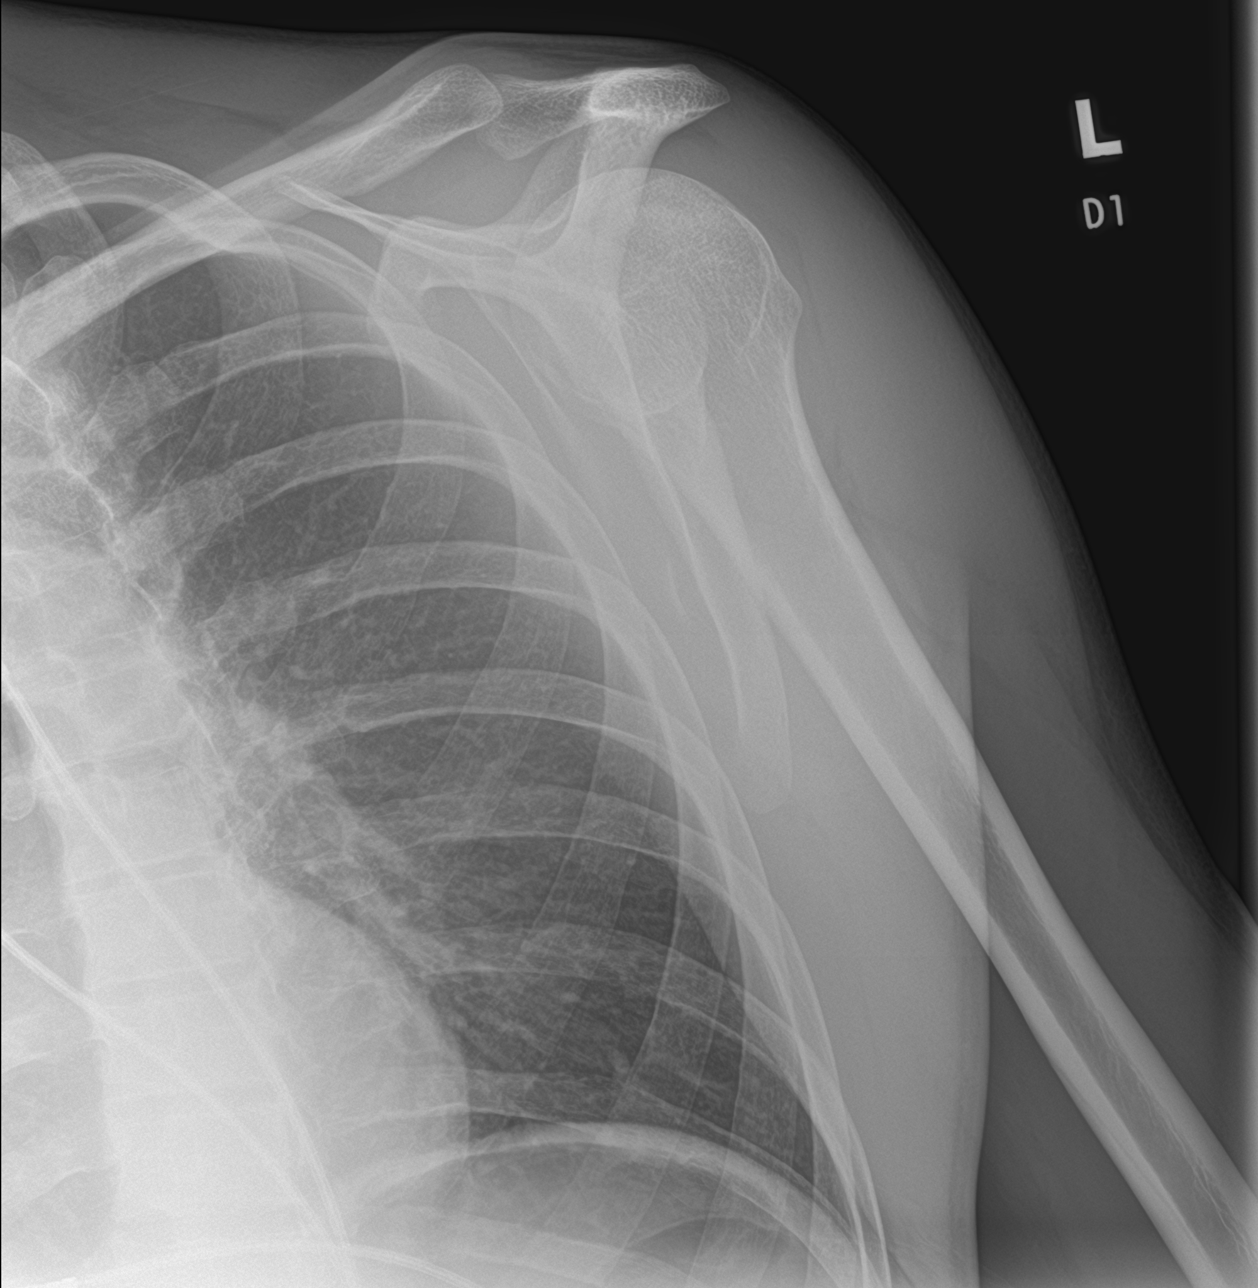

[shoulder ap neutral]
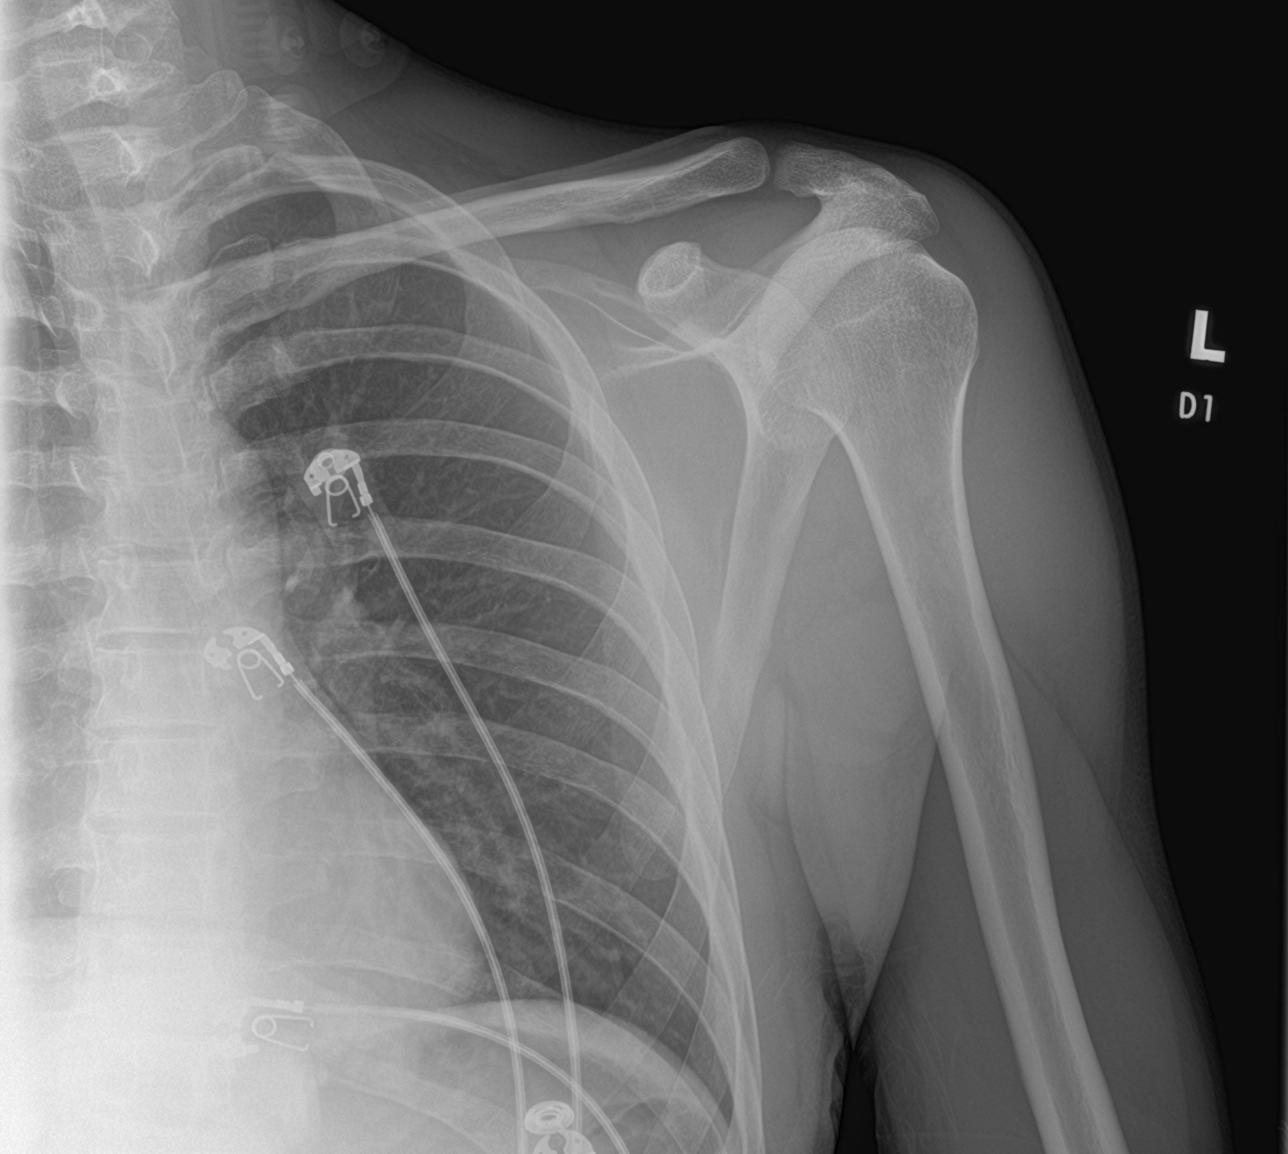

[3 of 3 positions shown; findings below may reference images not displayed]

FINDINGS: Slight contour deformity of the lateral humeral head may represent
Ourari Tiger impaction injury, age indeterminate but not definitively
seen in 5587. No other evidence of fracture. Humeral head may be a
slightly posteriorly located with respect to the glenoid, best
assessed on the transscapular Y-view. Acromioclavicular joint is
congruent. No acute findings in the included left hemithorax.
IMPRESSION: 1. Slight contour deformity of the lateral humeral head may
represent Hill-Sachs impaction injury from anterior dislocation, age
indeterminate but not definitively seen in 5587.
2. Possible mild posterior subluxation of the humeral head with
respect to the glenoid, best assessed on the transscapular Y-view.
Consider follow-up axillary view if patient is able for glenohumeral
assessment.

## 2022-12-24 NOTE — Progress Notes (Deleted)
Patient: Edward Brewer Date of Birth: 1990/06/14  Reason for Visit: Follow up for seizures, polysubstance abuse History From: Patient Primary Neurologist: Dr. Teresa Coombs  ASSESSMENT AND PLAN 32 y.o. year old male   1.  History of seizures 2.  Polysubstance abuse  -Last seizure was in January 2022 -Continue Depakote ER 500 mg, 2 tablets twice daily -Check routine labs today -Follow-up in 1 year or sooner if needed, discussed the importance of medication compliance  HISTORY OF PRESENT ILLNESS: Today 12/24/22   12/26/21 SS: Edward Brewer is here today for follow-up.  Depakote level at last visit in April 2023 was 36.  He remains on Depakote.  Denies any recent seizures.  He reports mild tremor intermittently in his hands as side effect of Depakote.  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  He drives a car, lives with his mother.  He is looking forward to being eligible for health insurance next year.  No problems or concerns.  Update 05/22/21 SS: Edward Brewer here today for follow-up. Last visit in Sept 2022 showed Depakote level 86, CMP/CBC unremarkable.  He is on Depakote ER 500 mg, 2 tablets twice daily.  Last seizure was in January 2022 in the setting of medication noncompliance and alcohol use. Tolerating Depakote, mild tremor intermittently in hands. Doesn't miss any doses. Lives with mom. He is back to driving just got back his license. 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 Denies any drug use in over 1 year.   HISTORY  11/08/2020 Dr. Anne Hahn: Edward Brewer is a 32 year old right-handed white male with a history of seizures.  The patient in the past has had issues with medical noncompliance with recurrent seizures.  He indicates that he has not had any seizures since last seen, he has been taking his medications as prescribed.  He is tolerating the Depakote well, he has some slight tremor in the morning and he takes his medication, but otherwise he  does fairly well on the medication.  He returns for further evaluation.  REVIEW OF SYSTEMS: Out of a complete 14 system review of symptoms, the patient complains only of the following symptoms, and all other reviewed systems are negative.   See HPI  ALLERGIES: No Known Allergies  HOME MEDICATIONS: Outpatient Medications Prior to Visit  Medication Sig Dispense Refill   divalproex (DEPAKOTE ER) 500 MG 24 hr tablet Take 2 tablets (1,000 mg total) by mouth 2 (two) times daily. 360 tablet 3   No facility-administered medications prior to visit.    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  Tobacco Use   Smoking status: Every Day    Current packs/day: 1.00    Types: Cigarettes   Smokeless tobacco: Never  Vaping  Use   Vaping status: Never Used  Substance and Sexual Activity   Alcohol use: No    Comment: 11/08/20 occasional 6 beers a week, 05/08/20 sober x 60 days   Drug use: Yes    Types: Marijuana, Heroin    Comment: 11/08/20 "1-2 x week, no other drugs"   Sexual activity: Not on file  Other Topics Concern   Not on file  Social History Narrative   11/08/20 has a job, lives with sig other   Social Determinants of Health   Financial Resource Strain: Not on file  Food Insecurity: Not on file  Transportation Needs: Not on file   Physical Activity: Not on file  Stress: Not on file  Social Connections: Not on file  Intimate Partner Violence: Not on file    PHYSICAL EXAM  There were no vitals filed for this visit.   There is no height or weight on file to calculate BMI.  Generalized: Well developed, in no acute distress  Neurological examination  Mentation: Alert oriented to time, place, history taking. Follows all commands speech and language fluent Cranial nerve II-XII: Pupils were equal round reactive to light. Extraocular movements were full, visual field were full on confrontational test. Facial sensation and strength were normal.  Head turning and shoulder shrug  were normal and symmetric. Motor: The motor testing reveals 5 over 5 strength of all 4 extremities. Good symmetric motor tone is noted throughout.  Sensory: Sensory testing is intact to soft touch on all 4 extremities. No evidence of extinction is noted.  Coordination: Cerebellar testing reveals good finger-nose-finger and heel-to-shin bilaterally.  No tremor noted. Gait and station: Gait is normal.  Tandem gait is normal. Reflexes: Deep tendon reflexes are symmetric and normal bilaterally.   DIAGNOSTIC DATA (LABS, IMAGING, TESTING) - I reviewed patient records, labs, notes, testing and imaging myself where available.  Lab Results  Component Value Date   WBC 7.0 12/26/2021   HGB 15.8 12/26/2021   HCT 47.6 12/26/2021   MCV 96 12/26/2021   PLT 202 12/26/2021      Component Value Date/Time   NA 141 12/26/2021 0924   K 4.6 12/26/2021 0924   CL 102 12/26/2021 0924   CO2 27 12/26/2021 0924   GLUCOSE 61 (L) 12/26/2021 0924   GLUCOSE 92 03/09/2020 1928   BUN 11 12/26/2021 0924   CREATININE 0.91 12/26/2021 0924   CALCIUM 10.0 12/26/2021 0924   PROT 6.8 12/26/2021 0924   ALBUMIN 4.6 12/26/2021 0924   AST 23 12/26/2021 0924   ALT 11 12/26/2021 0924   ALKPHOS 75 12/26/2021 0924   BILITOT <0.2 12/26/2021 0924   GFRNONAA >60 03/09/2020 1928    GFRAA >60 06/04/2018 0546   No results found for: "CHOL", "HDL", "LDLCALC", "LDLDIRECT", "TRIG", "CHOLHDL" No results found for: "HGBA1C" No results found for: "VITAMINB12" No results found for: "TSH"  Otila Kluver, DNP  [facility] [address] [place], [place] [address] [phone]

## 2022-12-25 ENCOUNTER — Ambulatory Visit: Payer: Self-pay | Admitting: Neurology

## 2022-12-25 ENCOUNTER — Encounter: Payer: Self-pay | Admitting: Neurology

## 2023-01-07 ENCOUNTER — Other Ambulatory Visit: Payer: Self-pay | Admitting: Neurology

## 2023-02-13 ENCOUNTER — Other Ambulatory Visit: Payer: Self-pay | Admitting: Neurology

## 2023-05-01 ENCOUNTER — Other Ambulatory Visit: Payer: Self-pay | Admitting: Neurology

## 2023-07-19 ENCOUNTER — Other Ambulatory Visit: Payer: Self-pay | Admitting: Neurology

## 2023-08-30 ENCOUNTER — Other Ambulatory Visit: Payer: Self-pay | Admitting: Neurology

## 2023-09-08 ENCOUNTER — Encounter: Payer: Self-pay | Admitting: Neurology

## 2023-09-08 ENCOUNTER — Ambulatory Visit (INDEPENDENT_AMBULATORY_CARE_PROVIDER_SITE_OTHER): Payer: Self-pay | Admitting: Neurology

## 2023-09-08 VITALS — BP 129/86 | HR 73 | Resp 15 | Ht 69.0 in | Wt 144.2 lb

## 2023-09-08 DIAGNOSIS — G40309 Generalized idiopathic epilepsy and epileptic syndromes, not intractable, without status epilepticus: Secondary | ICD-10-CM

## 2023-09-08 MED ORDER — DIVALPROEX SODIUM ER 500 MG PO TB24: 1000.0000 mg | ORAL_TABLET | Freq: Two times a day (BID) | ORAL | 3 refills | Status: AC

## 2023-09-08 NOTE — Patient Instructions (Signed)
 Continue Depakote , please do not miss any doses.  Please avoid drug use, sleep deprivation as this can be a trigger for breakthrough seizure.  [place]  driving law is no driving until seizure-free for 6 months.  Check labs today.  Call for seizure activity.  Follow-up in 6 months.

## 2023-09-08 NOTE — Progress Notes (Signed)
 Patient: Edward Brewer Date of Birth: 1990/07/06  Reason for Visit: Follow up for seizures, polysubstance abuse History From: Patient Primary Neurologist: Dr. Gregg  ASSESSMENT AND PLAN 33 y.o. year old male   1.  History of seizures 2.  Polysubstance abuse  - Last known seizure was January 2022, possible seizure event a few months ago, woke up had chewed his tongue?  - Currently out of Depakote , refill sent in, continue current dosing Depakote  ER 500 mg, 2 tablets twice daily - Discussed the importance of medication compliance, not to miss any doses. High risk for seizures with medication lapse, drug use, sleep deprivation  - [place] driving law is no driving until seizure free 6 months - Avoid drug use - Check routine labs today - Call for seizure activity, follow-up in 6 months  HISTORY OF PRESENT ILLNESS: Today 09/08/23 Has not been seen since Nov 2023. Works at [employer]. He missed work on Sunday, [place], got suspended for 1 week. He reports once this year he woke up and felt like he had bitten his tongue? Likely missed his medication, or had been partying with drug use. Seizure? Has generalized seizures. Is supposed to take Depakote  ER 500 mg 2 tablets twice daily. He cannot go back to work until Saturday due to missing work on Sunday as a no call, no show.   12/26/21 SS: Edward Brewer is here today for follow-up.  Depakote  level at last visit in April 2023 was 36.  He remains on Depakote .  Denies any recent seizures.  He reports mild tremor intermittently in his hands as side effect of Depakote .  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  He drives a car, lives with his mother.  He is looking forward to being eligible for health insurance next year.  No problems or concerns.  Update 05/22/21 SS: Edward Brewer here today for follow-up. Last visit in Sept 2022 showed Depakote  level 86, CMP/CBC unremarkable.  He is on Depakote  ER 500 mg, 2 tablets  twice daily.  Last seizure was in January 2022 in the setting of medication noncompliance and alcohol use. Tolerating Depakote , mild tremor intermittently in hands. Doesn't miss any doses. Lives with mom. He is back to driving just got back his license. 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 Denies any drug use in over 1 year.   HISTORY  11/08/2020 Dr. Jenel: Edward Brewer is a 33 year old right-handed white male with a history of seizures.  The patient in the past has had issues with medical noncompliance with recurrent seizures.  He indicates that he has not had any seizures since last seen, he has been taking his medications as prescribed.  He is tolerating the Depakote  well, he has some slight tremor in the morning and he takes his medication, but otherwise he does fairly well on the medication.  He returns for further evaluation.  REVIEW OF SYSTEMS: Out of a complete 14 system review of symptoms, the patient complains only of the following symptoms, and all other reviewed systems are negative.   See HPI  ALLERGIES: No Known Allergies  HOME MEDICATIONS: Outpatient Medications Prior to Visit  Medication Sig Dispense Refill   divalproex  (DEPAKOTE  ER) 500 MG 24 hr tablet TAKE 2 TABLETS BY MOUTH TWICE DAILY . APPOINTMENT REQUIRED FOR FUTURE REFILLS (Patient not taking: Reported on 09/08/2023) 14 tablet 0   No facility-administered medications prior to visit.    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 No pertinent surgical history.  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  Tobacco Use   Smoking status: Every Day    Current packs/day: 1.00    Types: Cigarettes   Smokeless tobacco: Never  Vaping Use   Vaping status: Never Used  Substance and Sexual Activity   Alcohol use: No    Comment: 11/08/20 occasional 6 beers a week, 05/08/20 sober x 60 days   Drug use: Yes    Types: Marijuana, Heroin    Comment: 11/08/20 1-2 x week, no other drugs   Sexual activity: Not on file  Other Topics Concern   Not on file  Social History Narrative   11/08/20 has a job, lives with sig other   Social Drivers of Corporate investment banker Strain: Not on file  Food Insecurity: Not on file  Transportation Needs: Not on file  Physical Activity: Not on file  Stress: Not on file  Social Connections: Not on file  Intimate Partner Violence: Not on file    PHYSICAL EXAM  Vitals:   09/08/23 1442  BP: 129/86  Pulse: 73  Resp: 15  SpO2: 98%  Weight: 144 lb 3.2 oz (65.4 kg)  Height: 5' 9 (1.753 m)    Body mass index is 21.29 kg/m.  Generalized: Well developed, in no acute distress  Neurological examination  Mentation: Alert oriented to time, place, history taking. Follows all commands speech and language fluent Cranial nerve II-XII: Pupils were equal round reactive to light. Extraocular movements were full, visual field were full on confrontational test. Facial sensation and strength were normal.  Head turning and shoulder shrug  were normal and symmetric. Motor: The motor testing reveals 5 over 5 strength of all 4 extremities. Good symmetric motor tone is noted throughout.  Sensory: Sensory testing is intact to soft touch on all 4 extremities. No evidence of extinction is noted.  Coordination: Cerebellar testing reveals good finger-nose-finger and  heel-to-shin bilaterally.  No tremor noted. Gait and station: Gait is normal.     DIAGNOSTIC DATA (LABS, IMAGING, TESTING) - I reviewed patient records, labs, notes, testing and imaging myself where available.  Lab Results  Component Value Date   WBC 7.0 12/26/2021   HGB 15.8 12/26/2021   HCT 47.6 12/26/2021   MCV 96 12/26/2021   PLT 202 12/26/2021      Component Value Date/Time   NA 141 12/26/2021 0924   K 4.6 12/26/2021 0924   CL 102 12/26/2021 0924   CO2 27 12/26/2021 0924   GLUCOSE 61 (L) 12/26/2021 0924   GLUCOSE 92 03/09/2020 1928   BUN 11 12/26/2021 0924   CREATININE 0.91 12/26/2021 0924   CALCIUM 10.0 12/26/2021 0924   PROT 6.8 12/26/2021 0924   ALBUMIN 4.6 12/26/2021 0924   AST 23 12/26/2021 0924   ALT 11 12/26/2021  0924   ALKPHOS 75 12/26/2021 0924   BILITOT <0.2 12/26/2021 0924   GFRNONAA >60 03/09/2020 1928   GFRAA >60 06/04/2018 0546   No results found for: CHOL, HDL, LDLCALC, LDLDIRECT, TRIG, CHOLHDL No results found for: YHAJ8R No results found for: VITAMINB12 No results found for: TSH  Lauraine Gayland MANDES, DNP  [facility] [address] [place], [place] [address] [phone]

## 2023-09-09 ENCOUNTER — Ambulatory Visit: Payer: Self-pay | Admitting: Neurology

## 2023-09-09 LAB — COMPREHENSIVE METABOLIC PANEL WITH GFR
ALT: 13 IU/L (ref 0–44)
AST: 28 IU/L (ref 0–40)
Albumin: 4.3 g/dL (ref 4.1–5.1)
Alkaline Phosphatase: 78 IU/L (ref 44–121)
BUN/Creatinine Ratio: 13 (ref 9–20)
BUN: 12 mg/dL (ref 6–20)
Bilirubin Total: 0.2 mg/dL (ref 0.0–1.2)
CO2: 24 mmol/L (ref 20–29)
Calcium: 9.8 mg/dL (ref 8.7–10.2)
Chloride: 102 mmol/L (ref 96–106)
Creatinine, Ser: 0.93 mg/dL (ref 0.76–1.27)
Globulin, Total: 2.4 g/dL (ref 1.5–4.5)
Glucose: 54 mg/dL — ABNORMAL LOW (ref 70–99)
Potassium: 4.3 mmol/L (ref 3.5–5.2)
Sodium: 142 mmol/L (ref 134–144)
Total Protein: 6.7 g/dL (ref 6.0–8.5)
eGFR: 110 mL/min/1.73 (ref >59)

## 2023-09-09 LAB — CBC WITH DIFFERENTIAL/PLATELET
Basophils Absolute: 0 x10E3/uL (ref 0.0–0.2)
Basos: 1 %
EOS (ABSOLUTE): 0.2 x10E3/uL (ref 0.0–0.4)
Eos: 3 %
Hematocrit: 48 % (ref 37.5–51.0)
Hemoglobin: 15.7 g/dL (ref 13.0–17.7)
Immature Grans (Abs): 0 x10E3/uL (ref 0.0–0.1)
Immature Granulocytes: 0 %
Lymphocytes Absolute: 0.9 x10E3/uL (ref 0.7–3.1)
Lymphs: 16 %
MCH: 31.7 pg (ref 26.6–33.0)
MCHC: 32.7 g/dL (ref 31.5–35.7)
MCV: 97 fL (ref 79–97)
Monocytes Absolute: 0.5 x10E3/uL (ref 0.1–0.9)
Monocytes: 8 %
Neutrophils Absolute: 4.4 x10E3/uL (ref 1.4–7.0)
Neutrophils: 72 %
Platelets: 277 x10E3/uL (ref 150–450)
RBC: 4.95 x10E6/uL (ref 4.14–5.80)
RDW: 12.6 % (ref 11.6–15.4)
WBC: 6 x10E3/uL (ref 3.4–10.8)

## 2023-09-09 LAB — VALPROIC ACID LEVEL: Valproic Acid Lvl: 7 ug/mL — ABNORMAL LOW (ref 50–100)

## 2023-09-09 NOTE — Telephone Encounter (Signed)
-----   Message from Lauraine JINNY Born sent at 09/09/2023  1:51 PM EDT ----- Please call, labs show low blood glucose, Depakote  level is low, but not unexpected since he ran out of medication.  Again please advise the importance of medication compliance, not to miss any  doses, keep routine follow-up appointment at our office to ensure no interruption in prescription. ----- Message ----- From: Interface, [facility] Lab Results In Sent: 09/09/2023   7:37 AM EDT To: Lauraine JINNY Born, NP

## 2023-11-24 ENCOUNTER — Encounter: Payer: Self-pay | Admitting: Emergency Medicine

## 2023-11-24 ENCOUNTER — Other Ambulatory Visit: Payer: Self-pay

## 2023-11-24 ENCOUNTER — Ambulatory Visit
Admission: EM | Admit: 2023-11-24 | Discharge: 2023-11-24 | Disposition: A | Discharge status: Home / Self Care | Payer: PRIVATE HEALTH INSURANCE | Attending: Family Medicine | Admitting: Family Medicine

## 2023-11-24 DIAGNOSIS — J00 Acute nasopharyngitis [common cold]: Secondary | ICD-10-CM

## 2023-11-24 MED ORDER — PROMETHAZINE-DM 6.25-15 MG/5ML PO SYRP: 5.0000 mL | ORAL_SOLUTION | Freq: Four times a day (QID) | ORAL | 0 refills | Status: AC | PRN

## 2023-11-24 NOTE — ED Provider Notes (Signed)
  [facility]ARE [facility]ENTER   [id number] 11/24/23 Arrival Time: 0907  ASSESSMENT & PLAN:  1. Common cold    Discussed typical duration of likely viral illness. OTC symptom care as needed. Work note provided.  Meds ordered this encounter  Medications   promethazine-dextromethorphan (PROMETHAZINE-DM) 6.25-15 MG/5ML syrup    Sig: Take 5 mLs by mouth 4 (four) times daily as needed for cough.    Dispense:  118 mL    Refill:  0     Follow-up Information     [facility]).   Specialty: Urgent Care Why: As needed. Contact information: [address] [place]  [address] [phone]                Reviewed expectations re: course of current medical issues. Questions answered. Outlined signs and symptoms indicating need for more acute intervention. Understanding verbalized. After Visit Summary given.   SUBJECTIVE: History from: Patient. Edward Brewer is a 33 y.o. male. Pt sts cough, congestion and hoarse voice upon waking this am; denies fever and needs work note  Denies: fever. Normal PO intake without n/v/d.  OBJECTIVE:  Vitals:   11/24/23 1114  BP: 117/88  Pulse: 95  Resp: 18  Temp: 98 F (36.7 C)  TempSrc: Oral  SpO2: 96%    General appearance: alert; no distress Eyes: PERRLA; EOMI; conjunctiva normal HENT: [place]; AT; with nasal congestion Neck: supple  Lungs: speaks full sentences without difficulty; unlabored Extremities: no edema Skin: warm and dry Neurologic: normal gait Psychological: alert and cooperative; normal mood and affect  Labs:  Labs Reviewed - No data to display  Imaging: No results found.  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 [employer]  Tobacco Use   Smoking status: Every Day    Current packs/day: 1.00    Types: Cigarettes   Smokeless tobacco: Never  Vaping Use   Vaping status: Never Used  Substance and Sexual Activity   Alcohol use: No    Comment: 11/08/20 occasional 6 beers a week, 05/08/20 sober x 60 days   Drug use: Yes    Types: Marijuana, Heroin    Comment: 11/08/20 1-2 x week, no other drugs   Sexual activity: Not on file  Other Topics Concern   Not on file  Social History Narrative   11/08/20 has a job, lives with sig other   Social Drivers of Corporate investment banker Strain: Not on file  Food Insecurity: Not on file  Transportation Needs: Not on file  Physical Activity: Not on file  Stress: Not on file  Social Connections: Not on file  Intimate Partner Violence: Not on file   Family History  Problem Relation Age of Onset   Diabetes Father    Seizures Neg Hx    History reviewed. No pertinent surgical history.   Rolinda Rogue, MD 11/24/23 1329

## 2023-11-24 NOTE — ED Triage Notes (Signed)
 Pt sts cough, congestion and hoarse voice upon waking this am; denies fever and needs work note

## 2024-02-02 ENCOUNTER — Other Ambulatory Visit: Payer: Self-pay

## 2024-02-02 ENCOUNTER — Encounter: Payer: Self-pay | Admitting: Emergency Medicine

## 2024-02-02 ENCOUNTER — Ambulatory Visit
Admission: EM | Admit: 2024-02-02 | Discharge: 2024-02-02 | Disposition: A | Discharge status: Home / Self Care | Payer: PRIVATE HEALTH INSURANCE

## 2024-02-02 DIAGNOSIS — J069 Acute upper respiratory infection, unspecified: Secondary | ICD-10-CM | POA: Diagnosis not present

## 2024-02-02 LAB — POC COVID19/FLU A&B COMBO
Covid Antigen, POC: NEGATIVE
Influenza A Antigen, POC: NEGATIVE
Influenza B Antigen, POC: NEGATIVE

## 2024-02-02 NOTE — ED Provider Notes (Signed)
 " [facility]    CSN: [id number] Arrival date & time: 02/02/24  1347      History   Chief Complaint Chief Complaint  Patient presents with   Cough    HPI Edward Brewer is a 33 y.o. male.   Patient presents today due to fatigue, cough productive of purulent sputum, nasal congestion, nasal drainage, and bodyaches that started yesterday.  Patient denies known sick contacts.  Patient states that he is eating and drinking without issue.  Patient states that he took OTC cold medication yesterday to get through work without significant relief.  The history is provided by the patient.  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 No pertinent surgical history.     Home Medications    Prior to Admission medications  Medication Sig Start Date End Date Taking? Authorizing Provider  divalproex  (DEPAKOTE  ER) 500 MG 24 hr tablet Take 2 tablets (1,000 mg total) by mouth in the morning and at bedtime. 09/08/23   Gayland Lauraine PARAS, NP  promethazine -dextromethorphan (PROMETHAZINE -DM) 6.25-15 MG/5ML syrup Take 5 mLs by mouth 4 (four) times daily as needed for cough. 11/24/23   Rolinda Rogue, MD    Family History Family History  Problem Relation Age of Onset   Diabetes Father    Seizures Neg Hx     Social History Social History[1]   Allergies   Patient has no known allergies.   Review of Systems Review of Systems  Respiratory:  Positive for cough.      Physical Exam Triage Vital Signs ED  Triage Vitals  Encounter Vitals Group     BP 02/02/24 1500 120/82     Girls Systolic BP Percentile --      Girls Diastolic BP Percentile --      Boys Systolic BP Percentile --      Boys Diastolic BP Percentile --      Pulse Rate 02/02/24 1500 92     Resp 02/02/24 1500 18     Temp 02/02/24 1500 (!) 97.4 F (36.3 C)     Temp Source 02/02/24 1500 Oral     SpO2 02/02/24 1500 98 %     Weight --      Height --      Head Circumference --      Peak Flow --      Pain Score 02/02/24 1457 0     Pain Loc --      Pain Education --      Exclude from Growth Chart --    No data found.  Updated Vital Signs BP 120/82 (BP Location: Right Arm)   Pulse 92   Temp (!) 97.4 F (36.3 C) (Oral)   Resp 18   SpO2 98%   Visual Acuity Right Eye Distance:   Left Eye Distance:   Bilateral Distance:    Right Eye Near:   Left Eye Near:    Bilateral Near:     Physical Exam Vitals and nursing note reviewed.  Constitutional:  General: He is not in acute distress.    Appearance: Normal appearance. He is not ill-appearing, toxic-appearing or diaphoretic.  HENT:     Nose: No congestion or rhinorrhea.     Mouth/Throat:     Mouth: Mucous membranes are moist.     Pharynx: Oropharynx is clear. No oropharyngeal exudate or posterior oropharyngeal erythema.  Eyes:     General: No scleral icterus. Cardiovascular:     Rate and Rhythm: Normal rate and regular rhythm.     Heart sounds: Normal heart sounds.  Pulmonary:     Effort: Pulmonary effort is normal. No respiratory distress.     Breath sounds: Normal breath sounds. No wheezing or rhonchi.  Skin:    General: Skin is warm.  Neurological:     Mental Status: He is alert and oriented to person, place, and time.  Psychiatric:        Mood and Affect: Mood normal.        Behavior: Behavior normal.      UC Treatments / Results  Labs (all labs ordered are listed, but only abnormal results are displayed) Labs Reviewed  POC COVID19/FLU A&B  COMBO - Normal    EKG   Radiology No results found.  Procedures Procedures (including critical care time)  Medications Ordered in UC Medications - No data to display  Initial Impression / Assessment and Plan / UC Course  I have reviewed the triage vital signs and the nursing notes.  Pertinent labs & imaging results that were available during my care of the patient were reviewed by me and considered in my medical decision making (see chart for details).    Final Clinical Impressions(s) / UC Diagnoses   Final diagnoses:  Viral URI   Discharge Instructions   None    ED Prescriptions   None    PDMP not reviewed this encounter.    [1]  Social History Tobacco Use   Smoking status: Every Day    Current packs/day: 1.00    Types: Cigarettes   Smokeless tobacco: Never  Vaping Use   Vaping status: Never Used  Substance Use Topics   Alcohol use: Yes    Comment: 11/08/20 occasional 6 beers a week, 05/08/20 sober x 60 days   Drug use: Yes    Types: Marijuana, Heroin    Comment: 11/08/20 1-2 x week, no other drugs     Andra Corean BROCKS, PA-C 02/02/24 1534  "

## 2024-02-02 NOTE — ED Triage Notes (Signed)
 Symptoms started yesterday.  Patient called out today.  States he needs a work note for his job  Patient has a cough, runny nose, stuffy ears, no energy.  Phlegm has been green and red tinged phlegm  Yesterday took dayquil, nothing taken today.  Patient thinks he is somewhat better today-throat better, congestion is worse

## 2024-02-02 NOTE — Discharge Instructions (Signed)

## 2024-04-14 ENCOUNTER — Ambulatory Visit: Payer: Self-pay | Admitting: Neurology
